# Patient Record
Sex: Male | Born: 1965 | Race: White | Hispanic: No | Marital: Married | Smoking: Former smoker
Health system: Southern US, Community
[De-identification: ages and names within clinical notes are randomized; demographics above are authoritative.]

## PROBLEM LIST (undated history)

## (undated) DIAGNOSIS — M1990 Unspecified osteoarthritis, unspecified site: Secondary | ICD-10-CM

---

## 2002-11-27 HISTORY — PX: KNEE ARTHROSCOPY: SHX127

## 2013-03-10 ENCOUNTER — Other Ambulatory Visit: Payer: Self-pay | Admitting: Neurosurgery

## 2013-03-11 ENCOUNTER — Encounter (HOSPITAL_COMMUNITY): Payer: Self-pay

## 2013-03-13 ENCOUNTER — Encounter (HOSPITAL_COMMUNITY)
Admission: RE | Admit: 2013-03-13 | Discharge: 2013-03-13 | Disposition: A | Discharge status: Home / Self Care | Payer: BC Managed Care – PPO | Source: Ambulatory Visit | Attending: Neurosurgery | Admitting: Neurosurgery

## 2013-03-13 ENCOUNTER — Encounter (HOSPITAL_COMMUNITY): Payer: Self-pay

## 2013-03-13 DIAGNOSIS — Z01812 Encounter for preprocedural laboratory examination: Secondary | ICD-10-CM | POA: Insufficient documentation

## 2013-03-13 HISTORY — DX: Unspecified osteoarthritis, unspecified site: M19.90

## 2013-03-13 LAB — SURGICAL PCR SCREEN: MRSA, PCR: NEGATIVE

## 2013-03-13 LAB — CBC
MCH: 30.1 pg (ref 26.0–34.0)
Platelets: 312 10*3/uL (ref 150–400)
RBC: 5.01 MIL/uL (ref 4.22–5.81)
RDW: 14 % (ref 11.5–15.5)
WBC: 11.3 10*3/uL — ABNORMAL HIGH (ref 4.0–10.5)

## 2013-03-13 NOTE — Pre-Procedure Instructions (Signed)
SIMRAN MANNIS  03/13/2013   Your procedure is scheduled on:  Wednesday, April 23rd.  Report to [facility] at 7:00  AM.  Call this number if you have problems the morning of surgery: [phone]   Remember:   Do not eat food or drink liquids after midnight.   Take these medicines the morning of surgery with A SIP OF WATER:  None  Stop taking Aspirin, Coumadin, Plavix, Effient and Herbal medications.  Do not take any NSAIDs ie: Ibuprofen,  Advil,Naproxen or any medication containing Aspirin.  Do not wear jewelry, make-up or nail polish.  Do not wear lotions, powders, or perfumes. You may wear deodorant.   Men may shave face and neck.  Do not bring valuables to the hospital.  Contacts, dentures or bridgework may not be worn into surgery.  Leave suitcase in the car. After surgery it may be brought to your room.  For patients admitted to the hospital, checkout time is 11:00 AM the day of  discharge.   Patients discharged the day of surgery will not be allowed to drive  home.  Name and phone number of your driver: -  Special Instructions: Shower using CHG 2 nights before surgery and the night before surgery.  If you shower the day of surgery use CHG.  Use special wash - you have one bottle of CHG for all showers.  You should use approximately 1/3 of the bottle for each shower.   Please read over the following fact sheets that you were given: Pain Booklet, Coughing and Deep Breathing and Surgical Site Infection Prevention

## 2013-03-19 ENCOUNTER — Inpatient Hospital Stay (HOSPITAL_COMMUNITY): Payer: BC Managed Care – PPO

## 2013-03-19 ENCOUNTER — Encounter (HOSPITAL_COMMUNITY): Payer: Self-pay | Admitting: Anesthesiology

## 2013-03-19 ENCOUNTER — Encounter (HOSPITAL_COMMUNITY): Payer: Self-pay | Admitting: *Deleted

## 2013-03-19 ENCOUNTER — Ambulatory Visit (HOSPITAL_COMMUNITY): Payer: BC Managed Care – PPO | Admitting: Anesthesiology

## 2013-03-19 ENCOUNTER — Inpatient Hospital Stay (HOSPITAL_COMMUNITY)
Admission: RE | Admit: 2013-03-19 | Discharge: 2013-03-21 | DRG: 758 | Disposition: A | Discharge status: Home / Self Care | Payer: BC Managed Care – PPO | Source: Ambulatory Visit | Attending: Neurosurgery | Admitting: Neurosurgery

## 2013-03-19 ENCOUNTER — Encounter (HOSPITAL_COMMUNITY)
Admission: RE | Disposition: A | Discharge status: Home / Self Care | Payer: Self-pay | Source: Ambulatory Visit | Attending: Neurosurgery

## 2013-03-19 DIAGNOSIS — M5126 Other intervertebral disc displacement, lumbar region: Principal | ICD-10-CM | POA: Diagnosis present

## 2013-03-19 DIAGNOSIS — Z87891 Personal history of nicotine dependence: Secondary | ICD-10-CM

## 2013-03-19 DIAGNOSIS — Z79899 Other long term (current) drug therapy: Secondary | ICD-10-CM

## 2013-03-19 DIAGNOSIS — Z01812 Encounter for preprocedural laboratory examination: Secondary | ICD-10-CM

## 2013-03-19 HISTORY — PX: LUMBAR LAMINECTOMY/DECOMPRESSION MICRODISCECTOMY: SHX5026

## 2013-03-19 HISTORY — PX: LUMBAR DISC SURGERY: SHX700

## 2013-03-19 SURGERY — LUMBAR LAMINECTOMY/DECOMPRESSION MICRODISCECTOMY 1 LEVEL
Anesthesia: General | Site: Back | Laterality: Right | Wound class: Clean

## 2013-03-19 MED ORDER — SODIUM CHLORIDE 0.9 % IV SOLN
INTRAVENOUS | Status: DC
  Administered 2013-03-20 – 2013-03-21 (×3): via INTRAVENOUS

## 2013-03-19 MED ORDER — DIAZEPAM 5 MG PO TABS
5.0000 mg | ORAL_TABLET | Freq: Four times a day (QID) | ORAL | Status: DC | PRN
  Administered 2013-03-19: 5 mg via ORAL

## 2013-03-19 MED ORDER — BUPIVACAINE LIPOSOME 1.3 % IJ SUSP: INTRAMUSCULAR | Status: DC | PRN

## 2013-03-19 MED ORDER — OXYCODONE HCL 5 MG/5ML PO SOLN: 5.0000 mg | Freq: Once | ORAL | Status: DC | PRN

## 2013-03-19 MED ORDER — OXYCODONE HCL 5 MG PO TABS: 5.0000 mg | ORAL_TABLET | Freq: Once | ORAL | Status: DC | PRN

## 2013-03-19 MED ORDER — PHENOL 1.4 % MT LIQD
1.0000 | OROMUCOSAL | Status: DC | PRN
  Administered 2013-03-20: 1 via OROMUCOSAL
  Filled 2013-03-19: qty 177

## 2013-03-19 MED ORDER — GLYCOPYRROLATE 0.2 MG/ML IJ SOLN
INTRAMUSCULAR | Status: DC | PRN
  Administered 2013-03-19: .6 mg via INTRAVENOUS

## 2013-03-19 MED ORDER — VECURONIUM BROMIDE 10 MG IV SOLR
INTRAVENOUS | Status: DC | PRN
  Administered 2013-03-19: 1 mg via INTRAVENOUS
  Administered 2013-03-19: 2 mg via INTRAVENOUS

## 2013-03-19 MED ORDER — NALOXONE HCL 0.4 MG/ML IJ SOLN: 0.4000 mg | INTRAMUSCULAR | Status: DC | PRN

## 2013-03-19 MED ORDER — ONDANSETRON HCL 4 MG/2ML IJ SOLN
INTRAMUSCULAR | Status: DC | PRN
  Administered 2013-03-19: 4 mg via INTRAVENOUS

## 2013-03-19 MED ORDER — DIPHENHYDRAMINE HCL 12.5 MG/5ML PO ELIX: 12.5000 mg | ORAL_SOLUTION | Freq: Four times a day (QID) | ORAL | Status: DC | PRN

## 2013-03-19 MED ORDER — SODIUM CHLORIDE 0.9 % IV SOLN: 250.0000 mL | INTRAVENOUS | Status: DC

## 2013-03-19 MED ORDER — ONDANSETRON HCL 4 MG/2ML IJ SOLN: 4.0000 mg | INTRAMUSCULAR | Status: DC | PRN

## 2013-03-19 MED ORDER — DIPHENHYDRAMINE HCL 50 MG/ML IJ SOLN: 12.5000 mg | Freq: Four times a day (QID) | INTRAMUSCULAR | Status: DC | PRN

## 2013-03-19 MED ORDER — MORPHINE SULFATE (PF) 1 MG/ML IV SOLN
INTRAVENOUS | Status: AC
  Filled 2013-03-19: qty 25

## 2013-03-19 MED ORDER — MIDAZOLAM HCL 5 MG/5ML IJ SOLN
INTRAMUSCULAR | Status: DC | PRN
  Administered 2013-03-19: 2 mg via INTRAVENOUS

## 2013-03-19 MED ORDER — FENTANYL CITRATE 0.05 MG/ML IJ SOLN
INTRAMUSCULAR | Status: DC | PRN
  Administered 2013-03-19 (×2): 50 ug via INTRAVENOUS
  Administered 2013-03-19: 75 ug via INTRAVENOUS
  Administered 2013-03-19 (×4): 50 ug via INTRAVENOUS
  Administered 2013-03-19: 125 ug via INTRAVENOUS

## 2013-03-19 MED ORDER — ZOLPIDEM TARTRATE 10 MG PO TABS: 10.0000 mg | ORAL_TABLET | Freq: Every evening | ORAL | Status: DC | PRN

## 2013-03-19 MED ORDER — SODIUM CHLORIDE 0.9 % IJ SOLN: 3.0000 mL | Freq: Two times a day (BID) | INTRAMUSCULAR | Status: DC

## 2013-03-19 MED ORDER — 0.9 % SODIUM CHLORIDE (POUR BTL) OPTIME
TOPICAL | Status: DC | PRN
  Administered 2013-03-19: 1000 mL

## 2013-03-19 MED ORDER — NEOSTIGMINE METHYLSULFATE 1 MG/ML IJ SOLN
INTRAMUSCULAR | Status: DC | PRN
  Administered 2013-03-19: 4 mg via INTRAVENOUS

## 2013-03-19 MED ORDER — METOCLOPRAMIDE HCL 5 MG/ML IJ SOLN: 10.0000 mg | Freq: Once | INTRAMUSCULAR | Status: DC | PRN

## 2013-03-19 MED ORDER — ARTIFICIAL TEARS OP OINT
TOPICAL_OINTMENT | OPHTHALMIC | Status: DC | PRN
  Administered 2013-03-19: 1 via OPHTHALMIC

## 2013-03-19 MED ORDER — BUPIVACAINE LIPOSOME 1.3 % IJ SUSP
20.0000 mL | INTRAMUSCULAR | Status: AC
  Administered 2013-03-19: 20 mL
  Filled 2013-03-19: qty 20

## 2013-03-19 MED ORDER — THROMBIN 5000 UNITS EX SOLR
CUTANEOUS | Status: DC | PRN
  Administered 2013-03-19 (×2): 5000 [IU] via TOPICAL

## 2013-03-19 MED ORDER — ONDANSETRON HCL 4 MG/2ML IJ SOLN: 4.0000 mg | Freq: Four times a day (QID) | INTRAMUSCULAR | Status: DC | PRN

## 2013-03-19 MED ORDER — HYDROMORPHONE HCL PF 1 MG/ML IJ SOLN: 0.2500 mg | INTRAMUSCULAR | Status: DC | PRN

## 2013-03-19 MED ORDER — PROPOFOL 10 MG/ML IV BOLUS
INTRAVENOUS | Status: DC | PRN
  Administered 2013-03-19: 200 mg via INTRAVENOUS

## 2013-03-19 MED ORDER — ACETAMINOPHEN 650 MG RE SUPP: 650.0000 mg | RECTAL | Status: DC | PRN

## 2013-03-19 MED ORDER — ROCURONIUM BROMIDE 100 MG/10ML IV SOLN
INTRAVENOUS | Status: DC | PRN
  Administered 2013-03-19: 50 mg via INTRAVENOUS

## 2013-03-19 MED ORDER — CEFAZOLIN SODIUM 1-5 GM-% IV SOLN
1.0000 g | Freq: Three times a day (TID) | INTRAVENOUS | Status: AC
  Administered 2013-03-19 – 2013-03-20 (×2): 1 g via INTRAVENOUS
  Filled 2013-03-19 (×2): qty 50

## 2013-03-19 MED ORDER — OXYCODONE-ACETAMINOPHEN 5-325 MG PO TABS
2.0000 | ORAL_TABLET | ORAL | Status: DC | PRN
  Administered 2013-03-19 – 2013-03-21 (×2): 2 via ORAL
  Filled 2013-03-19: qty 2

## 2013-03-19 MED ORDER — SODIUM CHLORIDE 0.9 % IJ SOLN: 9.0000 mL | INTRAMUSCULAR | Status: DC | PRN

## 2013-03-19 MED ORDER — ACETAMINOPHEN 325 MG PO TABS: 650.0000 mg | ORAL_TABLET | ORAL | Status: DC | PRN

## 2013-03-19 MED ORDER — DEXAMETHASONE SODIUM PHOSPHATE 10 MG/ML IJ SOLN
INTRAMUSCULAR | Status: DC | PRN
  Administered 2013-03-19: 10 mg via INTRAVENOUS

## 2013-03-19 MED ORDER — MENTHOL 3 MG MT LOZG: 1.0000 | LOZENGE | OROMUCOSAL | Status: DC | PRN

## 2013-03-19 MED ORDER — ZOLPIDEM TARTRATE 5 MG PO TABS: 10.0000 mg | ORAL_TABLET | Freq: Every evening | ORAL | Status: DC | PRN

## 2013-03-19 MED ORDER — LACTATED RINGERS IV SOLN
INTRAVENOUS | Status: DC | PRN
  Administered 2013-03-19 (×2): via INTRAVENOUS

## 2013-03-19 MED ORDER — HEMOSTATIC AGENTS (NO CHARGE) OPTIME
TOPICAL | Status: DC | PRN
  Administered 2013-03-19: 1 via TOPICAL

## 2013-03-19 MED ORDER — CEFAZOLIN SODIUM-DEXTROSE 2-3 GM-% IV SOLR
INTRAVENOUS | Status: AC
  Administered 2013-03-19: 2 g via INTRAVENOUS
  Filled 2013-03-19: qty 50

## 2013-03-19 MED ORDER — MORPHINE SULFATE (PF) 1 MG/ML IV SOLN
INTRAVENOUS | Status: DC
  Administered 2013-03-19: 6 mg via INTRAVENOUS
  Administered 2013-03-19: 3 mg via INTRAVENOUS
  Administered 2013-03-19: 14:00:00 via INTRAVENOUS
  Administered 2013-03-20: 3 mg via INTRAVENOUS
  Administered 2013-03-20: 17:00:00 via INTRAVENOUS
  Administered 2013-03-20: 6 mg via INTRAVENOUS
  Administered 2013-03-20: 12:00:00 via INTRAVENOUS
  Administered 2013-03-20: 1.5 mg via INTRAVENOUS
  Administered 2013-03-20: 18 mg via INTRAVENOUS
  Administered 2013-03-20: 36 mg via INTRAVENOUS
  Administered 2013-03-20: 8.45 mg via INTRAVENOUS
  Administered 2013-03-21: 2.61 mg via INTRAVENOUS
  Administered 2013-03-21: 10.5 mg via INTRAVENOUS
  Administered 2013-03-21: 9 mg via INTRAVENOUS
  Filled 2013-03-19 (×4): qty 25

## 2013-03-19 MED ORDER — LIDOCAINE HCL (CARDIAC) 20 MG/ML IV SOLN
INTRAVENOUS | Status: DC | PRN
  Administered 2013-03-19: 100 mg via INTRAVENOUS

## 2013-03-19 MED ORDER — OXYCODONE-ACETAMINOPHEN 5-325 MG PO TABS
ORAL_TABLET | ORAL | Status: AC
  Filled 2013-03-19: qty 2

## 2013-03-19 MED ORDER — SODIUM CHLORIDE 0.9 % IJ SOLN: 3.0000 mL | INTRAMUSCULAR | Status: DC | PRN

## 2013-03-19 MED ORDER — DIAZEPAM 5 MG PO TABS
ORAL_TABLET | ORAL | Status: AC
  Filled 2013-03-19: qty 1

## 2013-03-19 SURGICAL SUPPLY — 65 items
BENZOIN TINCTURE PRP APPL 2/3 (GAUZE/BANDAGES/DRESSINGS) ×2 IMPLANT
BLADE SURG ROTATE 9660 (MISCELLANEOUS) ×2 IMPLANT
BUR ACORN 6.0 (BURR) ×2 IMPLANT
BUR MATCHSTICK NEURO 3.0 LAGG (BURR) ×2 IMPLANT
CANISTER SUCTION 2500CC (MISCELLANEOUS) ×2 IMPLANT
CLOTH BEACON ORANGE TIMEOUT ST (SAFETY) ×2 IMPLANT
CONT SPEC 4OZ CLIKSEAL STRL BL (MISCELLANEOUS) ×2 IMPLANT
DRAPE LAPAROTOMY 100X72X124 (DRAPES) ×2 IMPLANT
DRAPE MICROSCOPE LEICA (MISCELLANEOUS) IMPLANT
DRAPE MICROSCOPE ZEISS OPMI (DRAPES) ×2 IMPLANT
DRAPE POUCH INSTRU U-SHP 10X18 (DRAPES) ×2 IMPLANT
DRSG PAD ABDOMINAL 8X10 ST (GAUZE/BANDAGES/DRESSINGS) ×2 IMPLANT
DURAPREP 26ML APPLICATOR (WOUND CARE) ×2 IMPLANT
DURASEAL APPLICATOR TIP (TIP) ×2 IMPLANT
DURASEAL SPINE SEALANT 3ML (MISCELLANEOUS) ×2 IMPLANT
ELECT BLADE 4.0 EZ CLEAN MEGAD (MISCELLANEOUS) ×2
ELECT REM PT RETURN 9FT ADLT (ELECTROSURGICAL) ×2
ELECTRODE BLDE 4.0 EZ CLN MEGD (MISCELLANEOUS) ×1 IMPLANT
ELECTRODE REM PT RTRN 9FT ADLT (ELECTROSURGICAL) ×1 IMPLANT
GAUZE SPONGE 4X4 16PLY XRAY LF (GAUZE/BANDAGES/DRESSINGS) IMPLANT
GLOVE BIOGEL M 8.0 STRL (GLOVE) ×2 IMPLANT
GLOVE BIOGEL PI IND STRL 7.5 (GLOVE) ×1 IMPLANT
GLOVE BIOGEL PI IND STRL 8 (GLOVE) ×2 IMPLANT
GLOVE BIOGEL PI IND STRL 8.5 (GLOVE) ×1 IMPLANT
GLOVE BIOGEL PI INDICATOR 7.5 (GLOVE) ×1
GLOVE BIOGEL PI INDICATOR 8 (GLOVE) ×2
GLOVE BIOGEL PI INDICATOR 8.5 (GLOVE) ×1
GLOVE ECLIPSE 6.5 STRL STRAW (GLOVE) ×4 IMPLANT
GLOVE ECLIPSE 7.5 STRL STRAW (GLOVE) ×2 IMPLANT
GLOVE EXAM NITRILE LRG STRL (GLOVE) IMPLANT
GLOVE EXAM NITRILE MD LF STRL (GLOVE) ×2 IMPLANT
GLOVE EXAM NITRILE XL STR (GLOVE) IMPLANT
GLOVE EXAM NITRILE XS STR PU (GLOVE) IMPLANT
GLOVE SURG SS PI 8.0 STRL IVOR (GLOVE) ×10 IMPLANT
GOWN BRE IMP SLV AUR LG STRL (GOWN DISPOSABLE) ×4 IMPLANT
GOWN BRE IMP SLV AUR XL STRL (GOWN DISPOSABLE) ×8 IMPLANT
GOWN STRL REIN 2XL LVL4 (GOWN DISPOSABLE) IMPLANT
KIT BASIN OR (CUSTOM PROCEDURE TRAY) ×2 IMPLANT
KIT ROOM TURNOVER OR (KITS) ×2 IMPLANT
NEEDLE HYPO 18GX1.5 BLUNT FILL (NEEDLE) IMPLANT
NEEDLE HYPO 21X1.5 SAFETY (NEEDLE) ×2 IMPLANT
NEEDLE HYPO 25X1 1.5 SAFETY (NEEDLE) ×2 IMPLANT
NEEDLE SPNL 20GX3.5 QUINCKE YW (NEEDLE) ×2 IMPLANT
NS IRRIG 1000ML POUR BTL (IV SOLUTION) ×2 IMPLANT
PACK LAMINECTOMY NEURO (CUSTOM PROCEDURE TRAY) ×2 IMPLANT
PAD ARMBOARD 7.5X6 YLW CONV (MISCELLANEOUS) ×6 IMPLANT
PATTIES SURGICAL .5 X1 (DISPOSABLE) ×2 IMPLANT
RUBBERBAND STERILE (MISCELLANEOUS) ×4 IMPLANT
SPONGE GAUZE 4X4 12PLY (GAUZE/BANDAGES/DRESSINGS) ×2 IMPLANT
SPONGE LAP 4X18 X RAY DECT (DISPOSABLE) IMPLANT
SPONGE SURGIFOAM ABS GEL SZ50 (HEMOSTASIS) ×2 IMPLANT
STRIP CLOSURE SKIN 1/2X4 (GAUZE/BANDAGES/DRESSINGS) ×2 IMPLANT
SUT ETHILON 3 0 FSL (SUTURE) ×2 IMPLANT
SUT PROLENE 6 0 BV (SUTURE) ×4 IMPLANT
SUT VIC AB 0 CT1 18XCR BRD8 (SUTURE) ×1 IMPLANT
SUT VIC AB 0 CT1 8-18 (SUTURE) ×1
SUT VIC AB 2-0 CP2 18 (SUTURE) ×2 IMPLANT
SUT VIC AB 3-0 SH 8-18 (SUTURE) ×2 IMPLANT
SYR 20CC LL (SYRINGE) ×2 IMPLANT
SYR 20ML ECCENTRIC (SYRINGE) ×2 IMPLANT
SYR 5ML LL (SYRINGE) IMPLANT
TAPE CLOTH SURG 4X10 WHT LF (GAUZE/BANDAGES/DRESSINGS) ×2 IMPLANT
TOWEL OR 17X24 6PK STRL BLUE (TOWEL DISPOSABLE) ×2 IMPLANT
TOWEL OR 17X26 10 PK STRL BLUE (TOWEL DISPOSABLE) ×2 IMPLANT
WATER STERILE IRR 1000ML POUR (IV SOLUTION) ×2 IMPLANT

## 2013-03-19 NOTE — OR Nursing (Signed)
Car assumed at arrival time noted in pacu

## 2013-03-19 NOTE — Anesthesia Postprocedure Evaluation (Signed)
Anesthesia Post Note  Patient: Samuel Robinson  Procedure(s) Performed: Procedure(s) (LRB): Right lumbar five-sacral one discectomy  (Right)  Anesthesia type: general  Patient location: PACU  Post pain: Pain level controlled  Post assessment: Patient's Cardiovascular Status Stable  Last Vitals:  Filed Vitals:   03/19/13 1335  BP:   Pulse: 54  Temp: 36.6 C  Resp: 16    Post vital signs: Reviewed and stable  Level of consciousness: sedated  Complications: No apparent anesthesia complications

## 2013-03-19 NOTE — H&P (Signed)
Samuel Robinson is an 47 y.o. male.   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 No pain in left leg  Past Medical History  Diagnosis Date  . Arthritis     ? elbow right    Past Surgical History  Procedure Laterality Date  . Knee arthroscopy Right 2004    History reviewed. No pertinent family history. Social History:  reports that he has quit smoking. He does not have any smokeless tobacco history on file. He reports that he does not drink alcohol or use illicit drugs.  Allergies: No Known Allergies  Medications Prior to Admission  Medication Sig Dispense Refill  . B Complex-C (B-COMPLEX WITH VITAMIN C) tablet Take 1 tablet by mouth daily.      . fish oil-omega-3 fatty acids 1000 MG capsule Take 1 g by mouth daily.      . Glucos-Chondroit-Hyaluron-MSM (GLUCOSAMINE CHONDROITIN JOINT PO) Take 1 tablet by mouth daily.      Marland Kitchen ibuprofen (ADVIL,MOTRIN) 200 MG tablet Take 200 mg by mouth every 6 (six) hours as needed for pain.      Marland Kitchen MAGNESIUM PO Take 1 capsule by mouth daily.      . Multiple Vitamin (MULTIVITAMIN WITH MINERALS) TABS Take 1 tablet by mouth daily.        No results found for this or any previous visit (from the past 48 hour(s)). No results found.  Review of Systems  HENT: Negative.   Eyes: Negative.   Respiratory: Negative.   Gastrointestinal: Negative.   Genitourinary: Negative.   Musculoskeletal: Positive for back pain.  Skin: Negative.   Neurological: Positive for focal weakness and weakness.  Endo/Heme/Allergies: Negative.   Psychiatric/Behavioral: Negative.     Blood pressure 124/82, pulse 55, temperature 97.9 F (36.6 C), temperature source Oral, resp. rate 20, SpO2 98.00%. Physical Exam hent,nl. Neck,nl. Cv, nl. Lungs,clear.abdomen,soft. Extremities nl.NEURO right  O/5 plantiflexion, 3/5  dorsiflexion.  athrophy of the fright calf by 3 inches.sensory, coldness of foot,nl pulses. Numbness of s1 dermetome. 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 Emg, chronic radiculopathy. Assessment/Plan Decompression of right l5s1 space with discectomy and foraminotomy. Patient aware of risks and benefits including the possibility of no improvement secondary to the athrophy  Samuel Robinson M 03/19/2013, 10:01 AM

## 2013-03-19 NOTE — Progress Notes (Signed)
Patient ID: Samuel Robinson, male   DOB: 01-06-1966, 47 y.o.   MRN: [id number] Right foot BACK to normal strength. No ha

## 2013-03-19 NOTE — Progress Notes (Signed)
Op note [id number]

## 2013-03-19 NOTE — Preoperative (Signed)
Beta Blockers   Reason not to administer Beta Blockers:Not Applicable 

## 2013-03-19 NOTE — Anesthesia Procedure Notes (Signed)
Procedure Name: Intubation Date/Time: 03/19/2013 10:47 AM Performed by: Angelica Pou Pre-anesthesia Checklist: Patient identified, Timeout performed, Emergency Drugs available, Suction available and Patient being monitored Patient Re-evaluated:Patient Re-evaluated prior to inductionOxygen Delivery Method: Circle system utilized Preoxygenation: Pre-oxygenation with 100% oxygen Intubation Type: IV induction Ventilation: Oral airway inserted - appropriate to patient size and Two handed mask ventilation required Laryngoscope Size: Mac and 4 Grade View: Grade II Tube type: Oral Tube size: 7.5 mm Airway Equipment and Method: Stylet and Oral airway Placement Confirmation: ETT inserted through vocal cords under direct vision,  breath sounds checked- equal and bilateral and positive ETCO2 Secured at: 23 cm Tube secured with: Tape Dental Injury: Teeth and Oropharynx as per pre-operative assessment

## 2013-03-19 NOTE — Anesthesia Preprocedure Evaluation (Signed)
Anesthesia Evaluation  Patient identified by MRN, date of birth, ID band Patient awake    Reviewed: Allergy & Precautions, H&P , NPO status , Patient's Chart, lab work & pertinent test results, reviewed documented beta blocker date and time   Airway Mallampati: II TM Distance: >3 FB Neck ROM: full    Dental   Pulmonary neg pulmonary ROS, former smoker,  breath sounds clear to auscultation        Cardiovascular negative cardio ROS  Rhythm:regular     Neuro/Psych negative neurological ROS  negative psych ROS   GI/Hepatic negative GI ROS, Neg liver ROS,   Endo/Other  negative endocrine ROS  Renal/GU negative Renal ROS  negative genitourinary   Musculoskeletal   Abdominal   Peds  Hematology negative hematology ROS (+)   Anesthesia Other Findings See surgeon's H&P   Reproductive/Obstetrics negative OB ROS                           Anesthesia Physical Anesthesia Plan  ASA: II  Anesthesia Plan: General   Post-op Pain Management:    Induction: Intravenous  Airway Management Planned: Oral ETT  Additional Equipment:   Intra-op Plan:   Post-operative Plan: Extubation in OR  Informed Consent: I have reviewed the patients History and Physical, chart, labs and discussed the procedure including the risks, benefits and alternatives for the proposed anesthesia with the patient or authorized representative who has indicated his/her understanding and acceptance.   Dental Advisory Given  Plan Discussed with: CRNA and Surgeon  Anesthesia Plan Comments:         Anesthesia Quick Evaluation  

## 2013-03-19 NOTE — Transfer of Care (Signed)
Immediate Anesthesia Transfer of Care Note  Patient: Samuel Robinson  Procedure(s) Performed: Procedure(s) with comments: Right lumbar five-sacral one discectomy  (Right) - Right Lumbar five-sacral one Diskectomy  Patient Location: PACU  Anesthesia Type:General  Level of Consciousness: awake, alert , oriented and patient cooperative  Airway & Oxygen Therapy: Patient Spontanous Breathing and Patient connected to nasal cannula oxygen  Post-op Assessment: Report given to PACU RN, Post -op Vital signs reviewed and stable and Patient moving all extremities X 4  Post vital signs: Reviewed and stable  Complications: No apparent anesthesia complications

## 2013-03-20 MED ORDER — IBUPROFEN 600 MG PO TABS
600.0000 mg | ORAL_TABLET | Freq: Four times a day (QID) | ORAL | Status: DC | PRN
  Administered 2013-03-21: 600 mg via ORAL
  Filled 2013-03-20: qty 1

## 2013-03-20 NOTE — Progress Notes (Signed)
03/20/2013 Cipriano Mile OTR/L Pager [phone] Office [phone]

## 2013-03-20 NOTE — Progress Notes (Signed)
Patient ID: Samuel Robinson, male   DOB: August 05, 1966, 47 y.o.   MRN: [id number] Seen this am. Normal strenght in right foot. No headache. Plan, oob in am and dc if stable

## 2013-03-20 NOTE — Progress Notes (Signed)
PT Cancellation Note  Patient Details Name: JADARIAN MCKAY MRN: [id number] DOB: [birth year]   Cancelled Treatment:    Reason Eval/Treat Not Completed: Per RN pt on bed rest today. Will f/u tomorrow.    [facility] HELEN 03/20/2013, 9:55 AM Pager: [phone]

## 2013-03-20 NOTE — Clinical Social Work Note (Signed)
Clinical Social Work   CSW received consult for SNF. CSW reviewed chart and discussed pt with RN during progression. Awaiting PT/OT evals for discharge recommendations. PT/OT evals are needed for prior auth for SNF placement. CSW will assess for SNF, if appropriate. CSW will continue to follow.   Kashay Cavenaugh, MSW, LCSW #[phone] 

## 2013-03-20 NOTE — Progress Notes (Signed)
Utilization review completed. Arlethia Basso, RN, BSN. 

## 2013-03-20 NOTE — Op Note (Signed)
NAME:  Samuel Robinson, Samuel Robinson                  ACCOUNT NO.:  [id number]  MEDICAL RECORD NO.:  [id number]  LOCATION:  [id number]                        FACILITY:  [facility]  PHYSICIAN:  Hilda Lias, M.D.   DATE OF BIRTH:  February 17, 1966  DATE OF PROCEDURE:  03/19/2013 DATE OF DISCHARGE:                              OPERATIVE REPORT   PREOPERATIVE DIAGNOSIS:  Right L5-S1 herniated disk with atrophy of the right calf 3/5 weakness with plantar flexion, 3/5 weakness with dorsiflexion.  POSTOPERATIVE DIAGNOSIS:  Right L5-S1 herniated disk with atrophy of the right calf 3/5 weakness with plantar flexion, 3/5 weakness with dorsiflexion.  PROCEDURE:  Right L5-S1 diskectomy, decompression of thecal sac, removal of large calcified disk at the axilla of the nerve.  Repair of durotomy. Microscope.  SURGEON:  Hilda Lias, M.D.  ASSISTANT:  Coletta Memos, M.D.  CLINICAL HISTORY:  [name] is a gentleman who had been complaining of back pain worsened to the right leg for more than 2 years.  By the time I saw him, he has atrophy of the right calf 3 inches smaller than the left one.  Absent at the ankle jerk.  0/5 weakness of plantar flexion, 3/5 weakness of dorsiflexion.  MRI showed large herniated disk at L5-1. The patient knew the risk with the surgery including possibility of no improvement, CSF leak because of the calcification.  PROCEDURE:  The patient was taken to the OR, and after intubation, he was positioned in a prone manner.  The back was cleaned with DuraPrep. Midline incision from L5-S1 was made and incision was carried down through a thick adipose tissue straight down to the lumbar area.  X-rays showed that we were right at the level of 5-1.  Because of the size, we brought the microscope into the area.  We drilled the lower lamina of L5 and the upper of S1 and thick calcified yellow ligament was also excised.  Immediately, we found that the dura mater was attached to the floor and it was  difficult to mobilize the S1 nerve root.  It was quite narrow and yellowish.  Decompression microdissection was carried out laterally and finally we were able to get into the lateral aspect of the disk space.  We made a small incision and using the micro curette, we worked our way to the midline and we were able to remove large midline calcified disk.  Decompression of the thecal sac as well as the S1 nerve root was done.  Then on investigation of the axilla, we found that the patient had a large fragment calcified attached to the axilla. Dissection was carried out at the end using the microcurette as well as the 1 mm Kerrison punch.  We were able to decompress the thecal sac and the S1 nerve root.  There was a small opening in the dura mater, which was taken care with a single stitch of 6-0 Prolene. Valsalva up to 40 was negative.  Then, the area was irrigated.  The area was closed with nylon.          ______________________________ Hilda Lias, M.D.     EB/MEDQ  D:  03/19/2013  T:  03/20/2013  Job:  [id number] 

## 2013-03-20 NOTE — Progress Notes (Signed)
Pt had not urinated since before surgery this morning. Pt on bedrest and in Trendelenberg position at -5 degrees.  Pt complaining of increasing abdominal pain. Pt was bladder scanned and only 200 cc of urine was detected. Md notified and order received to do an I&O cath.  I&O drained 1100 cc of urine out of the bladder. Pt's pain was relieved. RN will continue to monitor.

## 2013-03-21 ENCOUNTER — Encounter (HOSPITAL_COMMUNITY): Payer: Self-pay | Admitting: Neurosurgery

## 2013-03-21 NOTE — Clinical Social Work Note (Signed)
Clinical Social Work   CSW discussed pt with RN during progression. CSW was updated by PT and pt will not need SNF placement. CSW updated RNCM. CSW will sign off at this time, as no further needs identified. Please reconsult if a need arises prior to discharge.   Dede Query, MSW, LCSW [phone]

## 2013-03-21 NOTE — Discharge Summary (Signed)
Physician Discharge Summary  Patient ID: Samuel Robinson MRN: [id number] DOB/AGE: February 17, 1966 47 y.o.  Admit date: 03/19/2013 Discharge date: 03/21/2013  Admission Diagnoses:right l5s1 hnp chronic radiculopat Discharge Diagnoses: same.     Discharged Condition: no pain no weakness as preop  Hospital Course: surgery  Consults: none  Significant Diagnostic Studies: mri  Treatments: right l5s1 discectomy. duratotomy closure  Discharge Exam: Blood pressure 128/72, pulse 59, temperature 98.1 F (36.7 C), temperature source Oral, resp. rate 16, height 6\' 3"  (1.905 m), weight 136.079 kg (300 lb), SpO2 98.00%.no pain, no weakness. Ambulating. No headache  Disposition:hime on roxycodone and diazepam     Medication List    ASK your doctor about these medications       B-complex with vitamin C tablet  Take 1 tablet by mouth daily.     fish oil-omega-3 fatty acids 1000 MG capsule  Take 1 g by mouth daily.     GLUCOSAMINE CHONDROITIN JOINT PO  Take 1 tablet by mouth daily.     ibuprofen 200 MG tablet  Commonly known as:  ADVIL,MOTRIN  Take 200 mg by mouth every 6 (six) hours as needed for pain.     MAGNESIUM PO  Take 1 capsule by mouth daily.     multivitamin with minerals Tabs  Take 1 tablet by mouth daily.         Signed: Karn Cassis 03/21/2013, 11:04 AM

## 2013-03-21 NOTE — Care Management Note (Signed)
    Page 1 of 1   03/21/2013     2:08:23 PM   CARE MANAGEMENT NOTE 03/21/2013  Patient:  Samuel Robinson, Samuel Robinson   Account Number:  [id number]  Date Initiated:  03/20/2013  Documentation initiated by:  [facility]  Subjective/Objective Assessment:   Admitted postop L5-S1 discectomy and dura tear repair     Action/Plan:   PT/OT to eval after patient off bedrest  PT recommended outpatient PT after cleared by neurosurgeon   Anticipated DC Date:  03/23/2013   Anticipated DC Plan:  HOME/SELF CARE      DC Planning Services  CM consult      Choice offered to / List presented to:             Status of service:  Completed, signed off Medicare Important Message given?   (If response is "NO", the following Medicare IM given date fields will be blank) Date Medicare IM given:   Date Additional Medicare IM given:    Discharge Disposition:  HOME/SELF CARE  Per UR Regulation:  Reviewed for med. necessity/level of care/duration of stay  If discussed at [place] Length of Stay Meetings, dates discussed:    Comments:

## 2013-03-21 NOTE — Evaluation (Signed)
Occupational Therapy Evaluation Patient Details Name: SHRIHAN PUTT MRN: [id number] DOB: [birth year] Today's Date: 03/21/2013 Time: 4098-1191 OT Time Calculation (min): 17 min  OT Assessment / Plan / Recommendation Clinical Impression  Pt doing well following right L5-S1 diskectomy, decompression of thecal sac, removal of large calcified disk at the axilla of the nerve. Pt requires mod A with LB ADLs and is independent with ADL mobillity and set up/sup with UB ADLs. All education completed and no further acute or follow up OT services needed at this time, OT will sign off    OT Assessment  Patient does not need any further OT services    Follow Up Recommendations  No OT follow up    Barriers to Discharge  None    Equipment Recommendations  Tub/shower bench;Other (comment) (ADL A/E kit)    Recommendations for Other Services  None  Frequency       Precautions / Restrictions Precautions Precautions: Back Precaution Comments: verbalizes 3/3 back precautions andnis able to maintain during activity  Restrictions Weight Bearing Restrictions: No   Pertinent Vitals/Pain 2/10    ADL  Grooming: Performed;Wash/dry hands;Wash/dry face;Teeth care;Supervision/safety Where Assessed - Grooming: Unsupported standing Upper Body Bathing: Simulated;Supervision/safety;Set up Lower Body Bathing: Simulated;Moderate assistance Upper Body Dressing: Performed;Supervision/safety;Set up Lower Body Dressing: Simulated;Moderate assistance Toilet Transfer: Performed;Modified independent Toilet Transfer Method: Sit to Barista: Regular height toilet;Grab bars Toileting - Clothing Manipulation and Hygiene: Performed;Supervision/safety Where Assessed - Engineer, mining and Hygiene: Standing Tub/Shower Transfer: Performed;Modified independent Tub/Shower Transfer Method: Science writer: Grab bars;Transfer tub bench ADL Comments: Pt provded with  education and demo of ADL A/E, tub bench and handout for toileting aide for use at home    OT Diagnosis:    OT Problem List:   OT Treatment Interventions:     OT Goals    Visit Information  Last OT Received On: 03/21/13 Assistance Needed: +1    Subjective Data  Subjective: " I will go home this afternoon " Patient Stated Goal: To return home   Prior Functioning     Home Living Lives With: Significant other Available Help at Discharge: Family;Available PRN/intermittently Type of Home: House Home Access: Stairs to enter [employer] of Steps: 2 Entrance Stairs-Rails: None Home Layout: One level Bathroom Shower/Tub: Network engineer: None Prior Function Level of Independence: Independent Able to Take Stairs?: Yes Driving: Yes Vocation: Full time employment Comments: works in Clinical research associate at [employer]: No difficulties Dominant Hand: Right         Vision/Perception Vision - History Baseline Vision: Wears contacts Patient Visual Report: No change from baseline Perception Perception: Within Functional Limits   Cognition  Cognition Arousal/Alertness: Awake/alert Behavior During Therapy: WFL for tasks assessed/performed Overall Cognitive Status: Within Functional Limits for tasks assessed    Extremity/Trunk Assessment Right Upper Extremity Assessment RUE ROM/Strength/Tone: WFL for tasks assessed RUE Sensation: WFL - Light Touch RUE Coordination: WFL - gross/fine motor Left Upper Extremity Assessment LUE ROM/Strength/Tone: WFL for tasks assessed LUE Sensation: WFL - Light Touch LUE Coordination: WFL - gross/fine motor   Mobility Bed Mobility Bed Mobility: Not assessed Rolling Left: 6: Modified independent (Device/Increase time) Left Sidelying to Sit: HOB flat;6: Modified independent (Device/Increase time) Details for Bed Mobility Assistance: initial cueing for log  roll Transfers Sit to Stand: 6: Modified independent (Device/Increase time);Without upper extremity assist;From chair/3-in-1;From toilet Stand to Sit: 6: Modified independent (Device/Increase time);To chair/3-in-1;Without upper extremity assist;To toilet Details for  Transfer Assistance: initial cues for technique and posture     Exercise     Balance Balance Balance Assessed: Yes Static Standing Balance Static Standing - Balance Support: No upper extremity supported Static Standing - Level of Assistance: 7: Independent Dynamic Standing Balance Dynamic Standing - Balance Support: No upper extremity supported;During functional activity Dynamic Standing - Level of Assistance: 7: Independent   End of Session OT - End of Session Equipment Utilized During Treatment: Other (comment) (ADL A/E, tub bench) Patient left: in chair;with call bell/phone within reach  GO     Galen Manila 03/21/2013, 2:33 PM

## 2013-03-21 NOTE — Progress Notes (Signed)
Pt's heart rate has been dropping into the upper 40's throughout the night. All other vitals are stable and pt is asymptomatic. Pt on full-dose morphine PCA. Md notified. RN will continue to monitor.

## 2013-03-21 NOTE — Evaluation (Signed)
Physical Therapy Evaluation Patient Details Name: Samuel Robinson MRN: [id number] DOB: 02/17/1966 Today's Date: 03/21/2013 Time: 9147-8295 PT Time Calculation (min): 42 min  PT Assessment / Plan / Recommendation Clinical Impression  47 y/o male s/p right L5-S1 diskectomy, decompression of thecal sac, removal of large calcified disk at the axilla of the nerve. Moving well this morning. All education completed. F/u with Dr. Jeral Fruit to determine when he can begin OPPT. No further acute PT needs at thist ime.     PT Assessment  All further PT needs can be met in the next venue of care    Follow Up Recommendations  Outpatient PT (when cleared by neuro surgeon)    Does the patient have the potential to tolerate intense rehabilitation      Barriers to Discharge        Equipment Recommendations  None recommended by PT    Recommendations for Other Services     Frequency      Precautions / Restrictions Precautions Precautions: Back Precaution Booklet Issued: Yes (comment) Precaution Comments: verbalizes 3/3 back precautions, good demonstration with movement Restrictions Weight Bearing Restrictions: No   Pertinent Vitals/Pain Reports minor low back pain, 2/10      Mobility  Bed Mobility Bed Mobility: Rolling Left;Left Sidelying to Sit Rolling Left: 6: Modified independent (Device/Increase time) Left Sidelying to Sit: HOB flat;6: Modified independent (Device/Increase time) Details for Bed Mobility Assistance: initial cueing for log roll Transfers Transfers: Sit to Stand;Stand to Sit Sit to Stand: 6: Modified independent (Device/Increase time);With upper extremity assist;From bed Stand to Sit: 6: Modified independent (Device/Increase time);With upper extremity assist;To chair/3-in-1 Details for Transfer Assistance: initial cues for technique and posture Ambulation/Gait Ambulation/Gait Assistance: 6: Modified independent (Device/Increase time) Ambulation Distance (Feet): 400  Feet Assistive device: None Ambulation/Gait Assistance Details: slow gait with decreased trunk rotation and decreased step length, good balance, occasional cues for precautions Gait Pattern: Step-through pattern;Decreased stride length Gait velocity: WFL Stairs: Yes Stairs Assistance: 6: Modified independent (Device/Increase time) Stair Management Technique: One rail Right Number of Stairs: 4    Exercises     PT Diagnosis: Difficulty walking;Acute pain  PT Problem List: Decreased activity tolerance;Decreased strength PT Treatment Interventions:     PT Goals    Visit Information  Last PT Received On: 03/21/13 Assistance Needed: +1    Subjective Data      Prior Functioning  Home Living Lives With: Significant other Available Help at Discharge: Family;Available PRN/intermittently Type of Home: House Home Access: Stairs to enter [employer] of Steps: 2 Entrance Stairs-Rails: None Home Layout: One level Bathroom Shower/Tub: Engineer, manufacturing systems: Standard Home Adaptive Equipment: None Prior Function Level of Independence: Independent Able to Take Stairs?: Yes Driving: Yes Vocation: Full time employment Comments: works at Actor, Therapist, music: No difficulties    Cognition  Cognition Arousal/Alertness: Awake/alert Behavior During Therapy: WFL for tasks assessed/performed Overall Cognitive Status: Within Functional Limits for tasks assessed    Extremity/Trunk Assessment Right Upper Extremity Assessment RUE ROM/Strength/Tone: [facility] for tasks assessed Left Upper Extremity Assessment LUE ROM/Strength/Tone: WFL for tasks assessed Right Lower Extremity Assessment RLE ROM/Strength/Tone: Deficits;WFL for tasks assessed RLE ROM/Strength/Tone Deficits: 4+/5 DF/PF RLE Sensation: Deficits RLE Sensation Deficits: impaired LT lateral leg Left Lower Extremity Assessment LLE ROM/Strength/Tone: WFL for tasks assessed   Balance  Balance Balance Assessed: Yes Static Standing Balance Static Standing - Balance Support: No upper extremity supported Static Standing - Level of Assistance: 7: Independent  End of Session PT - End  of Session Equipment Utilized During Treatment: Gait belt Activity Tolerance: Patient tolerated treatment well Patient left: in chair;with call bell/phone within reach Nurse Communication: Mobility status  GP     [facility] HELEN 03/21/2013, 1:20 PM

## 2014-06-09 IMAGING — CR DG LUMBAR SPINE 1V
1 series · 1 of 1 positions shown · non-contrast
Comparison: MRI 01/06/2013

CLINICAL DATA: Low back pain

LUMBAR SPINE - 1 VIEW

[view not recorded]
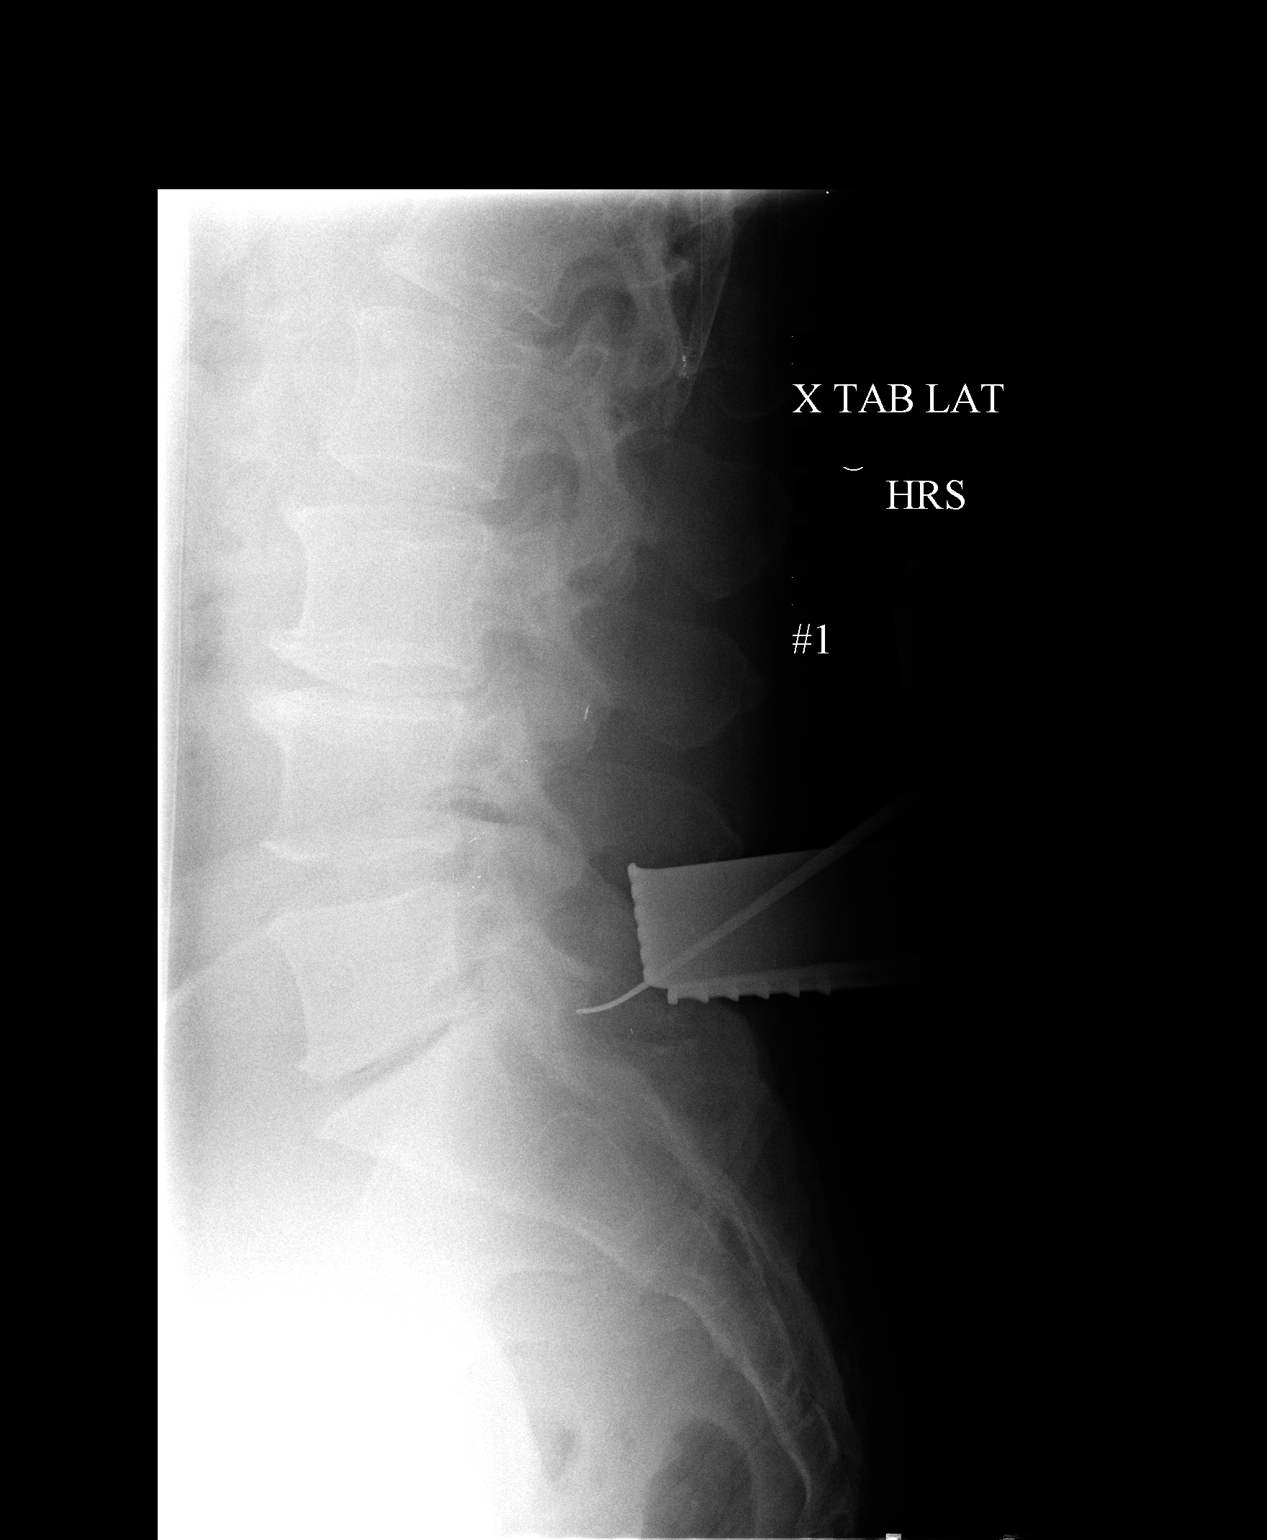

[1 of 1 positions shown; findings below may reference images not displayed]

FINDINGS: Cross-table lateral film #1 demonstrates a probe from
posterior approach directed most closely toward the L5-S1
interspace.
IMPRESSION: As above.

## 2016-07-07 DIAGNOSIS — Z0000 Encounter for general adult medical examination without abnormal findings: Secondary | ICD-10-CM | POA: Diagnosis not present

## 2016-07-07 DIAGNOSIS — Z125 Encounter for screening for malignant neoplasm of prostate: Secondary | ICD-10-CM | POA: Diagnosis not present

## 2017-02-21 DIAGNOSIS — K080 Exfoliation of teeth due to systemic causes: Secondary | ICD-10-CM | POA: Diagnosis not present

## 2017-03-12 DIAGNOSIS — M5136 Other intervertebral disc degeneration, lumbar region: Secondary | ICD-10-CM | POA: Diagnosis not present

## 2017-03-15 DIAGNOSIS — K080 Exfoliation of teeth due to systemic causes: Secondary | ICD-10-CM | POA: Diagnosis not present

## 2017-05-24 DIAGNOSIS — R05 Cough: Secondary | ICD-10-CM | POA: Diagnosis not present

## 2017-05-24 DIAGNOSIS — J40 Bronchitis, not specified as acute or chronic: Secondary | ICD-10-CM | POA: Diagnosis not present

## 2017-08-29 DIAGNOSIS — K080 Exfoliation of teeth due to systemic causes: Secondary | ICD-10-CM | POA: Diagnosis not present

## 2017-09-04 DIAGNOSIS — K080 Exfoliation of teeth due to systemic causes: Secondary | ICD-10-CM | POA: Diagnosis not present

## 2017-09-05 DIAGNOSIS — K080 Exfoliation of teeth due to systemic causes: Secondary | ICD-10-CM | POA: Diagnosis not present

## 2017-09-25 DIAGNOSIS — K080 Exfoliation of teeth due to systemic causes: Secondary | ICD-10-CM | POA: Diagnosis not present

## 2017-10-15 DIAGNOSIS — Z131 Encounter for screening for diabetes mellitus: Secondary | ICD-10-CM | POA: Diagnosis not present

## 2017-10-15 DIAGNOSIS — Z13220 Encounter for screening for lipoid disorders: Secondary | ICD-10-CM | POA: Diagnosis not present

## 2017-10-15 DIAGNOSIS — Z125 Encounter for screening for malignant neoplasm of prostate: Secondary | ICD-10-CM | POA: Diagnosis not present

## 2017-10-15 DIAGNOSIS — Z0000 Encounter for general adult medical examination without abnormal findings: Secondary | ICD-10-CM | POA: Diagnosis not present

## 2017-10-26 DIAGNOSIS — J0120 Acute ethmoidal sinusitis, unspecified: Secondary | ICD-10-CM | POA: Diagnosis not present

## 2018-02-27 DIAGNOSIS — K080 Exfoliation of teeth due to systemic causes: Secondary | ICD-10-CM | POA: Diagnosis not present

## 2018-04-08 DIAGNOSIS — E1165 Type 2 diabetes mellitus with hyperglycemia: Secondary | ICD-10-CM | POA: Diagnosis not present

## 2018-04-08 DIAGNOSIS — Z79899 Other long term (current) drug therapy: Secondary | ICD-10-CM | POA: Diagnosis not present

## 2018-04-08 DIAGNOSIS — N529 Male erectile dysfunction, unspecified: Secondary | ICD-10-CM | POA: Diagnosis not present

## 2018-04-08 DIAGNOSIS — E7800 Pure hypercholesterolemia, unspecified: Secondary | ICD-10-CM | POA: Diagnosis not present

## 2018-04-08 DIAGNOSIS — M5136 Other intervertebral disc degeneration, lumbar region: Secondary | ICD-10-CM | POA: Diagnosis not present

## 2018-08-29 DIAGNOSIS — K080 Exfoliation of teeth due to systemic causes: Secondary | ICD-10-CM | POA: Diagnosis not present

## 2018-10-09 DIAGNOSIS — E1165 Type 2 diabetes mellitus with hyperglycemia: Secondary | ICD-10-CM | POA: Diagnosis not present

## 2018-10-09 DIAGNOSIS — Z79899 Other long term (current) drug therapy: Secondary | ICD-10-CM | POA: Diagnosis not present

## 2018-10-09 DIAGNOSIS — E7800 Pure hypercholesterolemia, unspecified: Secondary | ICD-10-CM | POA: Diagnosis not present

## 2018-10-09 DIAGNOSIS — M5136 Other intervertebral disc degeneration, lumbar region: Secondary | ICD-10-CM | POA: Diagnosis not present

## 2018-12-31 DIAGNOSIS — M5416 Radiculopathy, lumbar region: Secondary | ICD-10-CM | POA: Diagnosis not present

## 2018-12-31 DIAGNOSIS — E782 Mixed hyperlipidemia: Secondary | ICD-10-CM | POA: Diagnosis not present

## 2018-12-31 DIAGNOSIS — R7303 Prediabetes: Secondary | ICD-10-CM | POA: Diagnosis not present

## 2018-12-31 DIAGNOSIS — Z6839 Body mass index (BMI) 39.0-39.9, adult: Secondary | ICD-10-CM | POA: Diagnosis not present

## 2019-01-06 DIAGNOSIS — J0101 Acute recurrent maxillary sinusitis: Secondary | ICD-10-CM | POA: Diagnosis not present

## 2019-01-06 DIAGNOSIS — J101 Influenza due to other identified influenza virus with other respiratory manifestations: Secondary | ICD-10-CM | POA: Diagnosis not present

## 2019-10-28 DIAGNOSIS — Z1159 Encounter for screening for other viral diseases: Secondary | ICD-10-CM | POA: Diagnosis not present

## 2019-10-28 DIAGNOSIS — M5416 Radiculopathy, lumbar region: Secondary | ICD-10-CM | POA: Diagnosis not present

## 2019-10-28 DIAGNOSIS — R7303 Prediabetes: Secondary | ICD-10-CM | POA: Diagnosis not present

## 2019-10-28 DIAGNOSIS — E782 Mixed hyperlipidemia: Secondary | ICD-10-CM | POA: Diagnosis not present

## 2020-01-08 ENCOUNTER — Encounter: Payer: Self-pay | Admitting: Diagnostic Neuroimaging

## 2020-01-23 ENCOUNTER — Other Ambulatory Visit: Payer: Self-pay

## 2020-01-23 MED ORDER — OXYCODONE HCL 10 MG PO TABS: 10.0000 mg | ORAL_TABLET | Freq: Three times a day (TID) | ORAL | 0 refills | Status: DC | PRN

## 2020-02-02 ENCOUNTER — Other Ambulatory Visit: Payer: Self-pay

## 2020-02-02 ENCOUNTER — Ambulatory Visit (INDEPENDENT_AMBULATORY_CARE_PROVIDER_SITE_OTHER): Payer: Federal, State, Local not specified - PPO | Admitting: Neurology

## 2020-02-02 ENCOUNTER — Ambulatory Visit: Payer: Federal, State, Local not specified - PPO | Admitting: Neurology

## 2020-02-02 DIAGNOSIS — Z0289 Encounter for other administrative examinations: Secondary | ICD-10-CM

## 2020-02-02 DIAGNOSIS — R29898 Other symptoms and signs involving the musculoskeletal system: Secondary | ICD-10-CM

## 2020-02-02 NOTE — Progress Notes (Signed)
EMG report is under procedure tab. 

## 2020-02-02 NOTE — Procedures (Signed)
Full Name: Samuel Robinson Gender: Male MRN #: [id number] Date of Birth: [birth year]    Visit Date: 02/02/2020 09:57 Age: 54 Years Examining Physician: Marcial Pacas, MD  Referring Physician: Reinaldo Meeker, MD History: 54 year old male, with history of right L5-S1 radiculopathy, status post decompression April 2014, continue complains of right foot numbness more than left foot, right leg weakness.  On examination, he has obesity, mild right calf muscle atrophy, mild right toe flexion, extension weakness, decreased vibratory sensation to distal shin level on the right side, to ankle level on the left side.  Bilateral patellar reflexes were decreased, absent ankle reflexes.  He walks with mildly limp, dragging his right leg, could not stand up on right tiptoe, mild difficulty with right heel walking.  Summary of the tests:  Nerve conduction study: Bilateral superficial sensory responses showed significantly decreased snap amplitude, with mildly prolonged peak latency.  Bilateral sural sensory responses were normal.  Left tibial motor responses showed mildly decreased conduction velocity, was otherwise normal.  Right tibial motor responses showed mildly prolonged distal latency, with normal CMAP amplitude, and slower conduction velocity.  Bilateral peroneal to EDB motor responses showed significantly decreased CMAP amplitude, and conduction velocity.  Electromyography:  Selected needle examination of bilateral lower extremity muscles, and bilateral lumbosacral paraspinal muscles were performed.  There is evidence of chronic neuropathic changes at right tibialis posterior, gastrocnemius, milder degree at right tibialis anterior, peroneal longus.  Right lumbar paraspinal muscles showed increased insertional activity, small polyphasic amplitude  Conclusion: This is an abnormal study.  There is electrodiagnostic evidence of chronic right L5-S1 radiculopathy, there is no evidence of active  process.  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    ------------------------------- Marcial Pacas M.D. PhD  [facility] [place], [place] [address] Tel: [phone] Fax: [phone]         [facility]    Nerve / Sites Muscle Latency Ref. Amplitude Ref. Rel Amp Segments Distance Velocity Ref. Area    ms ms mV mV %  cm m/s m/s mVms  R Peroneal - EDB     Ankle EDB 6.0 ?6.5 0.4 ?2.0 100 Ankle - EDB 9   1.4     Fib head EDB 15.9  0.4  101 Fib head - Ankle 33 33 ?44 1.0     Pop fossa EDB 18.9  0.4  94.6 Pop fossa - Fib head 10 33 ?44 0.9         Pop fossa - Ankle      L Peroneal - EDB     Ankle EDB 5.7 ?6.5 0.5 ?2.0 100 Ankle - EDB 9   2.3     Fib head EDB 14.4  0.5  97.5 Fib head - Ankle 33 38 ?44 1.2     Pop fossa EDB 17.3  0.5  109 Pop fossa - Fib head 10 35 ?44 1.3         Pop fossa - Ankle      R Tibial - AH     Ankle AH 6.7 ?5.8 4.4 ?4.0 100 Ankle - AH 9   11.8     Pop fossa AH 21.1  2.5  57 Pop fossa - Ankle 42 29 ?41 6.2  L Tibial - AH     Ankle AH 4.8 ?5.8 8.5 ?4.0 100 Ankle - AH 9   22.6     Pop fossa AH 16.5  4.9  57.9  Pop fossa - Ankle 41 35 ?41 21.6             SNC    Nerve / Sites Rec. Site Peak Lat Ref.  Amp Ref. Segments Distance    ms ms V V  cm  R Sural - Ankle (Calf)     Calf Ankle 4.1 ?4.4 12 ?6 Calf - Ankle 14  L Sural - Ankle (Calf)     Calf Ankle 4.4 ?4.4 9 ?6 Calf - Ankle 14  R Superficial peroneal - Ankle     Lat leg Ankle 4.8 ?4.4 1 ?6 Lat leg - Ankle 14  L Superficial peroneal - Ankle     Lat leg Ankle 4.7 ?4.4 4 ?6 Lat leg - Ankle 14             F  Wave    Nerve F Lat Ref.   ms ms  R Tibial - AH 70.9 ?56.0  L Tibial - AH 65.1 ?56.0         H Reflex    Nerve H Lat   ms   Left Right Ref.  Tibial - Soleus 38.6 30.2 ?35.0         EMG Summary Table    Spontaneous MUAP Recruitment  Muscle IA Fib PSW Fasc Other Amp Dur. Poly Pattern  R. Tibialis anterior Normal None  None None _______ Normal Normal Normal Reduced  R. Tibialis posterior Increased None None None _______ Increased Increased 1+ Reduced  R. Gastrocnemius (Medial head) Increased None None None _______ Increased Increased 1+ Reduced  R. Vastus lateralis Normal None None None _______ Normal Normal Normal Reduced  R. Lumbar paraspinals (mid) Increased None None None _______ Decreased Increased 1+ Normal  R. Lumbar paraspinals (low) Increased None None None _______ Increased Decreased 1+ Normal  L. Tibialis anterior Normal None None None _______ Normal Normal Normal Normal  L. Tibialis posterior Normal None None None _______ Normal Normal Normal Normal  L. Peroneus longus Normal None None None _______ Normal Normal Normal Normal  L. Gastrocnemius (Medial head) Normal None None None _______ Normal Normal Normal Normal  L. Vastus lateralis Normal None None None _______ Normal Normal Normal Normal  L. Lumbar paraspinals (mid) Normal None None None _______ Normal Normal Normal Normal  L. Lumbar paraspinals (low) Normal None None None _______ Normal Normal Normal Normal

## 2020-02-04 DIAGNOSIS — R29898 Other symptoms and signs involving the musculoskeletal system: Secondary | ICD-10-CM | POA: Insufficient documentation

## 2020-02-09 ENCOUNTER — Encounter: Payer: Self-pay | Admitting: Neurology

## 2020-03-05 DIAGNOSIS — M216X1 Other acquired deformities of right foot: Secondary | ICD-10-CM | POA: Diagnosis not present

## 2020-03-05 DIAGNOSIS — M25571 Pain in right ankle and joints of right foot: Secondary | ICD-10-CM | POA: Diagnosis not present

## 2020-03-05 DIAGNOSIS — M5417 Radiculopathy, lumbosacral region: Secondary | ICD-10-CM | POA: Diagnosis not present

## 2020-03-05 DIAGNOSIS — M79671 Pain in right foot: Secondary | ICD-10-CM | POA: Diagnosis not present

## 2020-04-27 ENCOUNTER — Ambulatory Visit: Payer: Self-pay | Admitting: Legal Medicine

## 2020-06-28 ENCOUNTER — Ambulatory Visit: Payer: Federal, State, Local not specified - PPO | Admitting: Legal Medicine

## 2020-06-28 ENCOUNTER — Other Ambulatory Visit: Payer: Self-pay

## 2020-06-28 ENCOUNTER — Encounter: Payer: Self-pay | Admitting: Legal Medicine

## 2020-06-28 DIAGNOSIS — Z6838 Body mass index (BMI) 38.0-38.9, adult: Secondary | ICD-10-CM | POA: Insufficient documentation

## 2020-06-28 DIAGNOSIS — E782 Mixed hyperlipidemia: Secondary | ICD-10-CM | POA: Diagnosis not present

## 2020-06-28 DIAGNOSIS — M5416 Radiculopathy, lumbar region: Secondary | ICD-10-CM | POA: Diagnosis not present

## 2020-06-28 DIAGNOSIS — F1010 Alcohol abuse, uncomplicated: Secondary | ICD-10-CM | POA: Insufficient documentation

## 2020-06-28 DIAGNOSIS — R079 Chest pain, unspecified: Secondary | ICD-10-CM | POA: Insufficient documentation

## 2020-06-28 DIAGNOSIS — E1169 Type 2 diabetes mellitus with other specified complication: Secondary | ICD-10-CM

## 2020-06-28 DIAGNOSIS — Z6839 Body mass index (BMI) 39.0-39.9, adult: Secondary | ICD-10-CM

## 2020-06-28 HISTORY — DX: Mixed hyperlipidemia: E78.2

## 2020-06-28 HISTORY — DX: Radiculopathy, lumbar region: M54.16

## 2020-06-28 HISTORY — DX: Morbid (severe) obesity due to excess calories: E66.01

## 2020-06-28 HISTORY — DX: Type 2 diabetes mellitus with other specified complication: E11.69

## 2020-06-28 LAB — POCT UA - MICROALBUMIN: Microalbumin Ur, POC: 80 mg/L

## 2020-06-28 MED ORDER — METFORMIN HCL 500 MG PO TABS: 500.0000 mg | ORAL_TABLET | Freq: Every day | ORAL | 2 refills | Status: DC

## 2020-06-28 MED ORDER — OXYCODONE HCL 10 MG PO TABS: 10.0000 mg | ORAL_TABLET | Freq: Three times a day (TID) | ORAL | 0 refills | Status: DC | PRN
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
## 2020-06-29 LAB — COMPREHENSIVE METABOLIC PANEL
ALT: 27 IU/L (ref 0–44)
AST: 21 IU/L (ref 0–40)
Albumin/Globulin Ratio: 1.7 (ref 1.2–2.2)
Albumin: 4.7 g/dL (ref 3.8–4.9)
Alkaline Phosphatase: 79 IU/L (ref 48–121)
BUN/Creatinine Ratio: 18 (ref 9–20)
BUN: 15 mg/dL (ref 6–24)
Bilirubin Total: 0.5 mg/dL (ref 0.0–1.2)
CO2: 23 mmol/L (ref 20–29)
Calcium: 9.7 mg/dL (ref 8.7–10.2)
Chloride: 102 mmol/L (ref 96–106)
Creatinine, Ser: 0.85 mg/dL (ref 0.76–1.27)
GFR calc Af Amer: 114 mL/min/{1.73_m2} (ref >59)
GFR calc non Af Amer: 99 mL/min/{1.73_m2} (ref >59)
Globulin, Total: 2.7 g/dL (ref 1.5–4.5)
Glucose: 145 mg/dL — ABNORMAL HIGH (ref 65–99)
Potassium: 5.2 mmol/L (ref 3.5–5.2)
Sodium: 139 mmol/L (ref 134–144)
Total Protein: 7.4 g/dL (ref 6.0–8.5)

## 2020-06-29 LAB — CBC WITH DIFFERENTIAL/PLATELET
Basophils Absolute: 0.1 10*3/uL (ref 0.0–0.2)
Basos: 1 %
EOS (ABSOLUTE): 0.3 10*3/uL (ref 0.0–0.4)
Eos: 3 %
Hematocrit: 46.1 % (ref 37.5–51.0)
Hemoglobin: 15.4 g/dL (ref 13.0–17.7)
Immature Grans (Abs): 0.1 10*3/uL (ref 0.0–0.1)
Immature Granulocytes: 1 %
Lymphocytes Absolute: 1.6 10*3/uL (ref 0.7–3.1)
Lymphs: 20 %
MCH: 29.4 pg (ref 26.6–33.0)
MCHC: 33.4 g/dL (ref 31.5–35.7)
MCV: 88 fL (ref 79–97)
Monocytes Absolute: 0.6 10*3/uL (ref 0.1–0.9)
Monocytes: 8 %
Neutrophils Absolute: 5.2 10*3/uL (ref 1.4–7.0)
Neutrophils: 67 %
Platelets: 315 10*3/uL (ref 150–450)
RBC: 5.24 x10E6/uL (ref 4.14–5.80)
RDW: 13.2 % (ref 11.6–15.4)
WBC: 7.8 10*3/uL (ref 3.4–10.8)

## 2020-06-29 LAB — HEMOGLOBIN A1C
Est. average glucose Bld gHb Est-mCnc: 154 mg/dL
Hgb A1c MFr Bld: 7 % — ABNORMAL HIGH (ref 4.8–5.6)

## 2020-06-29 LAB — LIPID PANEL
Chol/HDL Ratio: 2.9 ratio (ref 0.0–5.0)
Cholesterol, Total: 217 mg/dL — ABNORMAL HIGH (ref 100–199)
HDL: 75 mg/dL (ref >39)
LDL Chol Calc (NIH): 133 mg/dL — ABNORMAL HIGH (ref 0–99)
Triglycerides: 54 mg/dL (ref 0–149)
VLDL Cholesterol Cal: 9 mg/dL (ref 5–40)

## 2020-06-29 LAB — CARDIOVASCULAR RISK ASSESSMENT

## 2020-06-29 LAB — TSH: TSH: 2.02 u[IU]/mL (ref 0.450–4.500)

## 2020-06-29 NOTE — Progress Notes (Signed)
CBC normal, glucose 145, kidney and liver tests normal, Cholesterol high133 LDL he is at risk for cardiac disease, recommend low cholesterol diet and starting statin especially with diabetes, A1c 7.0, TSH 2.02 normal lp

## 2020-10-28 ENCOUNTER — Ambulatory Visit: Payer: Federal, State, Local not specified - PPO | Admitting: Legal Medicine

## 2020-10-28 ENCOUNTER — Other Ambulatory Visit: Payer: Self-pay

## 2020-10-28 ENCOUNTER — Encounter: Payer: Self-pay | Admitting: Legal Medicine

## 2020-10-28 VITALS — BP 120/80 | HR 48 | Temp 97.5°F | Resp 16 | Ht 75.0 in | Wt 307.6 lb

## 2020-10-28 DIAGNOSIS — Z1211 Encounter for screening for malignant neoplasm of colon: Secondary | ICD-10-CM

## 2020-10-28 DIAGNOSIS — M5416 Radiculopathy, lumbar region: Secondary | ICD-10-CM

## 2020-10-28 DIAGNOSIS — E782 Mixed hyperlipidemia: Secondary | ICD-10-CM | POA: Diagnosis not present

## 2020-10-28 DIAGNOSIS — Z6838 Body mass index (BMI) 38.0-38.9, adult: Secondary | ICD-10-CM

## 2020-10-28 DIAGNOSIS — E1169 Type 2 diabetes mellitus with other specified complication: Secondary | ICD-10-CM | POA: Diagnosis not present

## 2020-10-28 DIAGNOSIS — N400 Enlarged prostate without lower urinary tract symptoms: Secondary | ICD-10-CM | POA: Diagnosis not present

## 2020-10-28 MED ORDER — OXYCODONE HCL 10 MG PO TABS: 10.0000 mg | ORAL_TABLET | Freq: Three times a day (TID) | ORAL | 0 refills | Status: DC | PRN
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
## 2020-10-29 LAB — COMPREHENSIVE METABOLIC PANEL
ALT: 34 IU/L (ref 0–44)
AST: 30 IU/L (ref 0–40)
Albumin/Globulin Ratio: 1.8 (ref 1.2–2.2)
Albumin: 4.8 g/dL (ref 3.8–4.9)
Alkaline Phosphatase: 74 IU/L (ref 44–121)
BUN/Creatinine Ratio: 23 — ABNORMAL HIGH (ref 9–20)
BUN: 19 mg/dL (ref 6–24)
Bilirubin Total: 0.4 mg/dL (ref 0.0–1.2)
CO2: 23 mmol/L (ref 20–29)
Calcium: 9.5 mg/dL (ref 8.7–10.2)
Chloride: 102 mmol/L (ref 96–106)
Creatinine, Ser: 0.82 mg/dL (ref 0.76–1.27)
GFR calc Af Amer: 116 mL/min/{1.73_m2} (ref >59)
GFR calc non Af Amer: 100 mL/min/{1.73_m2} (ref >59)
Globulin, Total: 2.7 g/dL (ref 1.5–4.5)
Glucose: 144 mg/dL — ABNORMAL HIGH (ref 65–99)
Potassium: 4.7 mmol/L (ref 3.5–5.2)
Sodium: 138 mmol/L (ref 134–144)
Total Protein: 7.5 g/dL (ref 6.0–8.5)

## 2020-10-29 LAB — LIPID PANEL
Chol/HDL Ratio: 2.4 ratio (ref 0.0–5.0)
Cholesterol, Total: 212 mg/dL — ABNORMAL HIGH (ref 100–199)
HDL: 87 mg/dL (ref >39)
LDL Chol Calc (NIH): 117 mg/dL — ABNORMAL HIGH (ref 0–99)
Triglycerides: 45 mg/dL (ref 0–149)
VLDL Cholesterol Cal: 8 mg/dL (ref 5–40)

## 2020-10-29 LAB — PSA: Prostate Specific Ag, Serum: 0.5 ng/mL (ref 0.0–4.0)

## 2020-10-29 LAB — HEMOGLOBIN A1C
Est. average glucose Bld gHb Est-mCnc: 154 mg/dL
Hgb A1c MFr Bld: 7 % — ABNORMAL HIGH (ref 4.8–5.6)

## 2020-10-29 LAB — CBC WITH DIFFERENTIAL/PLATELET
Basophils Absolute: 0.1 10*3/uL (ref 0.0–0.2)
Basos: 1 %
EOS (ABSOLUTE): 0.2 10*3/uL (ref 0.0–0.4)
Eos: 3 %
Hematocrit: 43.2 % (ref 37.5–51.0)
Hemoglobin: 14.6 g/dL (ref 13.0–17.7)
Immature Grans (Abs): 0 10*3/uL (ref 0.0–0.1)
Immature Granulocytes: 1 %
Lymphocytes Absolute: 1.6 10*3/uL (ref 0.7–3.1)
Lymphs: 20 %
MCH: 29.6 pg (ref 26.6–33.0)
MCHC: 33.8 g/dL (ref 31.5–35.7)
MCV: 87 fL (ref 79–97)
Monocytes Absolute: 0.6 10*3/uL (ref 0.1–0.9)
Monocytes: 7 %
Neutrophils Absolute: 5.3 10*3/uL (ref 1.4–7.0)
Neutrophils: 68 %
Platelets: 306 10*3/uL (ref 150–450)
RBC: 4.94 x10E6/uL (ref 4.14–5.80)
RDW: 13.4 % (ref 11.6–15.4)
WBC: 7.8 10*3/uL (ref 3.4–10.8)

## 2020-10-29 LAB — CARDIOVASCULAR RISK ASSESSMENT

## 2020-10-29 NOTE — Progress Notes (Signed)
PSA 0.5 normal, glucose 144 high, kidney tests ok, liver tests ok, A1c 7.0 goodLDL cholesterol remains high 117 , cbc normal lp

## 2020-11-15 DIAGNOSIS — Z1211 Encounter for screening for malignant neoplasm of colon: Secondary | ICD-10-CM | POA: Diagnosis not present

## 2020-12-31 ENCOUNTER — Other Ambulatory Visit: Payer: Self-pay | Admitting: Legal Medicine

## 2021-01-27 ENCOUNTER — Ambulatory Visit: Payer: Federal, State, Local not specified - PPO | Admitting: Legal Medicine

## 2021-03-26 ENCOUNTER — Other Ambulatory Visit: Payer: Self-pay | Admitting: Legal Medicine

## 2021-03-26 DIAGNOSIS — E1169 Type 2 diabetes mellitus with other specified complication: Secondary | ICD-10-CM

## 2021-04-28 ENCOUNTER — Other Ambulatory Visit: Payer: Self-pay

## 2021-04-28 ENCOUNTER — Encounter: Payer: Self-pay | Admitting: Legal Medicine

## 2021-04-28 ENCOUNTER — Other Ambulatory Visit: Payer: Self-pay | Admitting: Legal Medicine

## 2021-04-28 ENCOUNTER — Ambulatory Visit: Payer: Federal, State, Local not specified - PPO | Admitting: Legal Medicine

## 2021-04-28 VITALS — BP 130/60 | HR 60 | Temp 97.8°F | Resp 16 | Ht 75.0 in | Wt 308.0 lb

## 2021-04-28 DIAGNOSIS — E1169 Type 2 diabetes mellitus with other specified complication: Secondary | ICD-10-CM | POA: Diagnosis not present

## 2021-04-28 DIAGNOSIS — E782 Mixed hyperlipidemia: Secondary | ICD-10-CM | POA: Diagnosis not present

## 2021-04-28 DIAGNOSIS — N400 Enlarged prostate without lower urinary tract symptoms: Secondary | ICD-10-CM

## 2021-04-28 DIAGNOSIS — M5416 Radiculopathy, lumbar region: Secondary | ICD-10-CM

## 2021-04-28 DIAGNOSIS — Z6838 Body mass index (BMI) 38.0-38.9, adult: Secondary | ICD-10-CM

## 2021-04-28 LAB — POCT UA - MICROALBUMIN: Microalbumin Ur, POC: 30 mg/L

## 2021-04-28 MED ORDER — OXYCODONE HCL 10 MG PO TABS: 10.0000 mg | ORAL_TABLET | Freq: Three times a day (TID) | ORAL | 0 refills | Status: DC | PRN
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
## 2021-04-29 LAB — CBC WITH DIFFERENTIAL/PLATELET
Basophils Absolute: 0.1 10*3/uL (ref 0.0–0.2)
Basos: 1 %
EOS (ABSOLUTE): 0.2 10*3/uL (ref 0.0–0.4)
Eos: 3 %
Hematocrit: 44.9 % (ref 37.5–51.0)
Hemoglobin: 15.1 g/dL (ref 13.0–17.7)
Immature Grans (Abs): 0 10*3/uL (ref 0.0–0.1)
Immature Granulocytes: 0 %
Lymphocytes Absolute: 1.5 10*3/uL (ref 0.7–3.1)
Lymphs: 21 %
MCH: 29.7 pg (ref 26.6–33.0)
MCHC: 33.6 g/dL (ref 31.5–35.7)
MCV: 88 fL (ref 79–97)
Monocytes Absolute: 0.6 10*3/uL (ref 0.1–0.9)
Monocytes: 8 %
Neutrophils Absolute: 5.1 10*3/uL (ref 1.4–7.0)
Neutrophils: 67 %
Platelets: 299 10*3/uL (ref 150–450)
RBC: 5.09 x10E6/uL (ref 4.14–5.80)
RDW: 13.6 % (ref 11.6–15.4)
WBC: 7.5 10*3/uL (ref 3.4–10.8)

## 2021-04-29 LAB — HEMOGLOBIN A1C
Est. average glucose Bld gHb Est-mCnc: 154 mg/dL
Hgb A1c MFr Bld: 7 % — ABNORMAL HIGH (ref 4.8–5.6)

## 2021-04-29 LAB — COMPREHENSIVE METABOLIC PANEL
ALT: 32 IU/L (ref 0–44)
AST: 22 IU/L (ref 0–40)
Albumin/Globulin Ratio: 1.7 (ref 1.2–2.2)
Albumin: 4.7 g/dL (ref 3.8–4.9)
Alkaline Phosphatase: 67 IU/L (ref 44–121)
BUN/Creatinine Ratio: 21 — ABNORMAL HIGH (ref 9–20)
BUN: 17 mg/dL (ref 6–24)
Bilirubin Total: 0.5 mg/dL (ref 0.0–1.2)
CO2: 23 mmol/L (ref 20–29)
Calcium: 9.7 mg/dL (ref 8.7–10.2)
Chloride: 102 mmol/L (ref 96–106)
Creatinine, Ser: 0.82 mg/dL (ref 0.76–1.27)
Globulin, Total: 2.7 g/dL (ref 1.5–4.5)
Glucose: 133 mg/dL — ABNORMAL HIGH (ref 65–99)
Potassium: 5 mmol/L (ref 3.5–5.2)
Sodium: 140 mmol/L (ref 134–144)
Total Protein: 7.4 g/dL (ref 6.0–8.5)
eGFR: 104 mL/min/{1.73_m2} (ref >59)

## 2021-04-29 LAB — LIPID PANEL
Chol/HDL Ratio: 2.6 ratio (ref 0.0–5.0)
Cholesterol, Total: 213 mg/dL — ABNORMAL HIGH (ref 100–199)
HDL: 83 mg/dL (ref >39)
LDL Chol Calc (NIH): 122 mg/dL — ABNORMAL HIGH (ref 0–99)
Triglycerides: 47 mg/dL (ref 0–149)
VLDL Cholesterol Cal: 8 mg/dL (ref 5–40)

## 2021-04-29 LAB — CARDIOVASCULAR RISK ASSESSMENT

## 2021-04-29 LAB — PSA: Prostate Specific Ag, Serum: 0.6 ng/mL (ref 0.0–4.0)

## 2021-04-29 NOTE — Progress Notes (Signed)
Glucose 133, kidney tests normal, liver tests normal, A1c 7.0 good, LDLcholesterol 122 high- really need a statin to lower cardiovascular disease, cbc normal, PSA 0.6 normal,  lp

## 2021-09-28 ENCOUNTER — Encounter: Payer: Self-pay | Admitting: Legal Medicine

## 2021-09-28 ENCOUNTER — Telehealth (INDEPENDENT_AMBULATORY_CARE_PROVIDER_SITE_OTHER): Payer: Federal, State, Local not specified - PPO | Admitting: Legal Medicine

## 2021-09-28 VITALS — Ht 75.0 in | Wt 308.0 lb

## 2021-09-28 DIAGNOSIS — J0110 Acute frontal sinusitis, unspecified: Secondary | ICD-10-CM | POA: Diagnosis not present

## 2021-09-28 DIAGNOSIS — U071 COVID-19: Secondary | ICD-10-CM

## 2021-09-28 MED ORDER — MOLNUPIRAVIR EUA 200MG CAPSULE: 4.0000 | ORAL_CAPSULE | Freq: Two times a day (BID) | ORAL | 0 refills | Status: AC

## 2021-09-28 MED ORDER — AZITHROMYCIN 250 MG PO TABS: ORAL_TABLET | ORAL | 0 refills | Status: AC
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
## 2021-10-28 ENCOUNTER — Other Ambulatory Visit: Payer: Self-pay

## 2021-10-28 ENCOUNTER — Encounter: Payer: Self-pay | Admitting: Legal Medicine

## 2021-10-28 ENCOUNTER — Ambulatory Visit: Payer: Federal, State, Local not specified - PPO | Admitting: Legal Medicine

## 2021-10-28 VITALS — BP 122/70 | HR 55 | Temp 97.2°F | Ht 75.0 in | Wt 304.0 lb

## 2021-10-28 DIAGNOSIS — M5416 Radiculopathy, lumbar region: Secondary | ICD-10-CM

## 2021-10-28 DIAGNOSIS — E1169 Type 2 diabetes mellitus with other specified complication: Secondary | ICD-10-CM | POA: Diagnosis not present

## 2021-10-28 DIAGNOSIS — Z6838 Body mass index (BMI) 38.0-38.9, adult: Secondary | ICD-10-CM

## 2021-10-28 DIAGNOSIS — E782 Mixed hyperlipidemia: Secondary | ICD-10-CM

## 2021-10-28 MED ORDER — OXYCODONE HCL 10 MG PO TABS: 10.0000 mg | ORAL_TABLET | Freq: Three times a day (TID) | ORAL | 0 refills | Status: DC | PRN
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
## 2021-10-29 LAB — COMPREHENSIVE METABOLIC PANEL
ALT: 28 IU/L (ref 0–44)
AST: 23 IU/L (ref 0–40)
Albumin/Globulin Ratio: 2 (ref 1.2–2.2)
Albumin: 4.9 g/dL (ref 3.8–4.9)
Alkaline Phosphatase: 73 IU/L (ref 44–121)
BUN/Creatinine Ratio: 19 (ref 9–20)
BUN: 16 mg/dL (ref 6–24)
Bilirubin Total: 0.4 mg/dL (ref 0.0–1.2)
CO2: 23 mmol/L (ref 20–29)
Calcium: 9.4 mg/dL (ref 8.7–10.2)
Chloride: 102 mmol/L (ref 96–106)
Creatinine, Ser: 0.83 mg/dL (ref 0.76–1.27)
Globulin, Total: 2.4 g/dL (ref 1.5–4.5)
Glucose: 137 mg/dL — ABNORMAL HIGH (ref 70–99)
Potassium: 4.5 mmol/L (ref 3.5–5.2)
Sodium: 139 mmol/L (ref 134–144)
Total Protein: 7.3 g/dL (ref 6.0–8.5)
eGFR: 103 mL/min/{1.73_m2} (ref >59)

## 2021-10-29 LAB — LIPID PANEL
Chol/HDL Ratio: 2.8 ratio (ref 0.0–5.0)
Cholesterol, Total: 213 mg/dL — ABNORMAL HIGH (ref 100–199)
HDL: 77 mg/dL (ref >39)
LDL Chol Calc (NIH): 129 mg/dL — ABNORMAL HIGH (ref 0–99)
Triglycerides: 41 mg/dL (ref 0–149)
VLDL Cholesterol Cal: 7 mg/dL (ref 5–40)

## 2021-10-29 LAB — CBC WITH DIFFERENTIAL/PLATELET
Basophils Absolute: 0.1 10*3/uL (ref 0.0–0.2)
Basos: 1 %
EOS (ABSOLUTE): 0.2 10*3/uL (ref 0.0–0.4)
Eos: 3 %
Hematocrit: 46 % (ref 37.5–51.0)
Hemoglobin: 15.3 g/dL (ref 13.0–17.7)
Immature Grans (Abs): 0 10*3/uL (ref 0.0–0.1)
Immature Granulocytes: 1 %
Lymphocytes Absolute: 1.7 10*3/uL (ref 0.7–3.1)
Lymphs: 22 %
MCH: 29.1 pg (ref 26.6–33.0)
MCHC: 33.3 g/dL (ref 31.5–35.7)
MCV: 88 fL (ref 79–97)
Monocytes Absolute: 0.6 10*3/uL (ref 0.1–0.9)
Monocytes: 8 %
Neutrophils Absolute: 5 10*3/uL (ref 1.4–7.0)
Neutrophils: 65 %
Platelets: 267 10*3/uL (ref 150–450)
RBC: 5.25 x10E6/uL (ref 4.14–5.80)
RDW: 13.2 % (ref 11.6–15.4)
WBC: 7.6 10*3/uL (ref 3.4–10.8)

## 2021-10-29 LAB — HEMOGLOBIN A1C
Est. average glucose Bld gHb Est-mCnc: 160 mg/dL
Hgb A1c MFr Bld: 7.2 % — ABNORMAL HIGH (ref 4.8–5.6)

## 2021-10-29 LAB — CARDIOVASCULAR RISK ASSESSMENT

## 2021-10-30 ENCOUNTER — Encounter: Payer: Self-pay | Admitting: Legal Medicine

## 2021-10-30 NOTE — Progress Notes (Signed)
CBC normal, glucose 137, kidney tests normal, liver tests normal, A1c 7.2, LDL cholesterol high 129 need to lower risk from cardiovascular disease, consider statin or zetia, possible IM praulent  lp

## 2021-12-23 ENCOUNTER — Other Ambulatory Visit: Payer: Self-pay | Admitting: Legal Medicine

## 2021-12-23 DIAGNOSIS — E1169 Type 2 diabetes mellitus with other specified complication: Secondary | ICD-10-CM

## 2022-02-10 ENCOUNTER — Encounter: Payer: Self-pay | Admitting: Nurse Practitioner

## 2022-02-10 ENCOUNTER — Other Ambulatory Visit: Payer: Self-pay

## 2022-02-10 ENCOUNTER — Ambulatory Visit: Payer: Federal, State, Local not specified - PPO | Admitting: Nurse Practitioner

## 2022-02-10 VITALS — BP 136/82 | HR 87 | Temp 97.6°F | Ht 75.0 in | Wt 309.0 lb

## 2022-02-10 DIAGNOSIS — R0981 Nasal congestion: Secondary | ICD-10-CM | POA: Diagnosis not present

## 2022-02-10 DIAGNOSIS — J0181 Other acute recurrent sinusitis: Secondary | ICD-10-CM

## 2022-02-10 DIAGNOSIS — M5416 Radiculopathy, lumbar region: Secondary | ICD-10-CM

## 2022-02-10 LAB — POC COVID19 BINAXNOW: SARS Coronavirus 2 Ag: NEGATIVE

## 2022-02-10 MED ORDER — OXYCODONE HCL 10 MG PO TABS: 10.0000 mg | ORAL_TABLET | Freq: Three times a day (TID) | ORAL | 0 refills | Status: DC | PRN

## 2022-02-10 MED ORDER — AZITHROMYCIN 250 MG PO TABS: ORAL_TABLET | ORAL | 0 refills | Status: AC

## 2022-02-10 MED ORDER — FLUTICASONE PROPIONATE 50 MCG/ACT NA SUSP: 2.0000 | Freq: Every day | NASAL | 6 refills | Status: DC

## 2022-02-10 NOTE — Patient Instructions (Addendum)
Flonase nasal spray daily ?Z-pack as directed ?Follow-up as needed ? ? ? ?Sinusitis, Adult ?Sinusitis is soreness and swelling (inflammation) of your sinuses. Sinuses are hollow spaces in the bones around your face. They are located: ?Around your eyes. ?In the middle of your forehead. ?Behind your nose. ?In your cheekbones. ?Your sinuses and nasal passages are lined with a fluid called mucus. Mucus drains out of your sinuses. Swelling can trap mucus in your sinuses. This lets germs (bacteria, virus, or fungus) grow, which leads to infection. Most of the time, this condition is caused by a virus. ?What are the causes? ?This condition is caused by: ?Allergies. ?Asthma. ?Germs. ?Things that block your nose or sinuses. ?Growths in the nose (nasal polyps). ?Chemicals or irritants in the air. ?Fungus (rare). ?What increases the risk? ?You are more likely to develop this condition if: ?You have a weak body defense system (immune system). ?You do a lot of swimming or diving. ?You use nasal sprays too much. ?You smoke. ?What are the signs or symptoms? ?The main symptoms of this condition are pain and a feeling of pressure around the sinuses. Other symptoms include: ?Stuffy nose (congestion). ?Runny nose (drainage). ?Swelling and warmth in the sinuses. ?Headache. ?Toothache. ?A cough that may get worse at night. ?Mucus that collects in the throat or the back of the nose (postnasal drip). ?Being unable to smell and taste. ?Being very tired (fatigue). ?A fever. ?Sore throat. ?Bad breath. ?How is this diagnosed? ?This condition is diagnosed based on: ?Your symptoms. ?Your medical history. ?A physical exam. ?Tests to find out if your condition is short-term (acute) or [place]-term (chronic). Your doctor may: ?Check your nose for growths (polyps). ?Check your sinuses using a tool that has a light (endoscope). ?Check for allergies or germs. ?Do imaging tests, such as an MRI or CT scan. ?How is this treated? ?Treatment for this  condition depends on the cause and whether it is short-term or [place]-term. ?If caused by a virus, your symptoms should go away on their own within 10 days. You may be given medicines to relieve symptoms. They include: ?Medicines that shrink swollen tissue in the nose. ?Medicines that treat allergies (antihistamines). ?A spray that treats swelling of the nostrils.  ?Rinses that help get rid of thick mucus in your nose (nasal saline washes). ?If caused by bacteria, your doctor may wait to see if you will get better without treatment. You may be given antibiotic medicine if you have: ?A very bad infection. ?A weak body defense system. ?If caused by growths in the nose, you may need to have surgery. ?Follow these instructions at home: ?Medicines ?Take, use, or apply over-the-counter and prescription medicines only as told by your doctor. These may include nasal sprays. ?If you were prescribed an antibiotic medicine, take it as told by your doctor. Do not stop taking the antibiotic even if you start to feel better. ?Hydrate and humidify ? ?Drink enough water to keep your pee (urine) pale yellow. ?Use a cool mist humidifier to keep the humidity level in your home above 50%. ?Breathe in steam for 10-15 minutes, 3-4 times a day, or as told by your doctor. You can do this in the bathroom while a hot shower is running. ?Try not to spend time in cool or dry air. ?Rest ?Rest as much as you can. ?Sleep with your head raised (elevated). ?Make sure you get enough sleep each night. ?General instructions ? ?Put a warm, moist washcloth on your face 3-4 times a day,  or as often as told by your doctor. This will help with discomfort. ?Wash your hands often with soap and water. If there is no soap and water, use hand sanitizer. ?Do not smoke. Avoid being around people who are smoking (secondhand smoke). ?Keep all follow-up visits as told by your doctor. This is important. ?Contact a doctor if: ?You have a fever. ?Your symptoms get  worse. ?Your symptoms do not get better within 10 days. ?Get help right away if: ?You have a very bad headache. ?You cannot stop throwing up (vomiting). ?You have very bad pain or swelling around your face or eyes. ?You have trouble seeing. ?You feel confused. ?Your neck is stiff. ?You have trouble breathing. ?Summary ?Sinusitis is swelling of your sinuses. Sinuses are hollow spaces in the bones around your face. ?This condition is caused by tissues in your nose that become inflamed or swollen. This traps germs. These can lead to infection. ?If you were prescribed an antibiotic medicine, take it as told by your doctor. Do not stop taking it even if you start to feel better. ?Keep all follow-up visits as told by your doctor. This is important. ?This information is not intended to replace advice given to you by your health care provider. Make sure you discuss any questions you have with your health care provider. ?Document Revised: 04/15/2018 Document Reviewed: 04/15/2018 ?Elsevier Patient Education ? [place]. ? ?
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
## 2022-05-02 ENCOUNTER — Ambulatory Visit: Payer: Federal, State, Local not specified - PPO | Admitting: Legal Medicine

## 2022-05-02 ENCOUNTER — Encounter: Payer: Self-pay | Admitting: Legal Medicine

## 2022-05-02 VITALS — BP 134/78 | HR 68 | Temp 97.2°F | Ht 75.0 in | Wt 306.0 lb

## 2022-05-02 DIAGNOSIS — E1169 Type 2 diabetes mellitus with other specified complication: Secondary | ICD-10-CM | POA: Diagnosis not present

## 2022-05-02 DIAGNOSIS — G894 Chronic pain syndrome: Secondary | ICD-10-CM

## 2022-05-02 DIAGNOSIS — E782 Mixed hyperlipidemia: Secondary | ICD-10-CM | POA: Diagnosis not present

## 2022-05-02 DIAGNOSIS — Z6838 Body mass index (BMI) 38.0-38.9, adult: Secondary | ICD-10-CM

## 2022-05-02 DIAGNOSIS — G8929 Other chronic pain: Secondary | ICD-10-CM | POA: Insufficient documentation

## 2022-05-02 DIAGNOSIS — M5416 Radiculopathy, lumbar region: Secondary | ICD-10-CM

## 2022-05-02 MED ORDER — OXYCODONE HCL 10 MG PO TABS: 10.0000 mg | ORAL_TABLET | Freq: Three times a day (TID) | ORAL | 0 refills | Status: DC | PRN
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
## 2022-05-02 NOTE — Assessment & Plan Note (Signed)
2014

## 2022-05-03 LAB — COMPREHENSIVE METABOLIC PANEL
ALT: 33 IU/L (ref 0–44)
AST: 21 IU/L (ref 0–40)
Albumin/Globulin Ratio: 2 (ref 1.2–2.2)
Albumin: 4.6 g/dL (ref 3.8–4.9)
Alkaline Phosphatase: 68 IU/L (ref 44–121)
BUN/Creatinine Ratio: 22 — ABNORMAL HIGH (ref 9–20)
BUN: 19 mg/dL (ref 6–24)
Bilirubin Total: 0.5 mg/dL (ref 0.0–1.2)
CO2: 22 mmol/L (ref 20–29)
Calcium: 9.3 mg/dL (ref 8.7–10.2)
Chloride: 102 mmol/L (ref 96–106)
Creatinine, Ser: 0.86 mg/dL (ref 0.76–1.27)
Globulin, Total: 2.3 g/dL (ref 1.5–4.5)
Glucose: 136 mg/dL — ABNORMAL HIGH (ref 70–99)
Potassium: 4.5 mmol/L (ref 3.5–5.2)
Sodium: 139 mmol/L (ref 134–144)
Total Protein: 6.9 g/dL (ref 6.0–8.5)
eGFR: 102 mL/min/{1.73_m2} (ref >59)

## 2022-05-03 LAB — LIPID PANEL
Chol/HDL Ratio: 2.8 ratio (ref 0.0–5.0)
Cholesterol, Total: 229 mg/dL — ABNORMAL HIGH (ref 100–199)
HDL: 83 mg/dL (ref >39)
LDL Chol Calc (NIH): 139 mg/dL — ABNORMAL HIGH (ref 0–99)
Triglycerides: 43 mg/dL (ref 0–149)
VLDL Cholesterol Cal: 7 mg/dL (ref 5–40)

## 2022-05-03 LAB — HEMOGLOBIN A1C
Est. average glucose Bld gHb Est-mCnc: 166 mg/dL
Hgb A1c MFr Bld: 7.4 % — ABNORMAL HIGH (ref 4.8–5.6)

## 2022-05-03 LAB — CARDIOVASCULAR RISK ASSESSMENT

## 2022-05-03 NOTE — Progress Notes (Signed)
Glucose 136, kidney tests normal, Liver tests normal, A1c 7.4 good, LDL cholesterol 139 high suggest statin, high risk of CV death,  lp

## 2022-06-28 ENCOUNTER — Ambulatory Visit: Payer: Federal, State, Local not specified - PPO | Admitting: Legal Medicine

## 2022-06-28 VITALS — BP 140/80 | HR 56 | Temp 98.8°F | Resp 15 | Ht 75.0 in | Wt 294.0 lb

## 2022-06-28 DIAGNOSIS — R599 Enlarged lymph nodes, unspecified: Secondary | ICD-10-CM

## 2022-06-28 DIAGNOSIS — A09 Infectious gastroenteritis and colitis, unspecified: Secondary | ICD-10-CM

## 2022-06-28 DIAGNOSIS — R197 Diarrhea, unspecified: Secondary | ICD-10-CM | POA: Insufficient documentation

## 2022-06-28 DIAGNOSIS — E1169 Type 2 diabetes mellitus with other specified complication: Secondary | ICD-10-CM | POA: Diagnosis not present

## 2022-06-28 MED ORDER — SULFAMETHOXAZOLE-TRIMETHOPRIM 800-160 MG PO TABS: 1.0000 | ORAL_TABLET | Freq: Two times a day (BID) | ORAL | 0 refills | Status: DC
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
## 2022-06-29 DIAGNOSIS — A09 Infectious gastroenteritis and colitis, unspecified: Secondary | ICD-10-CM | POA: Diagnosis not present

## 2022-06-29 LAB — CBC WITH DIFFERENTIAL/PLATELET
Basophils Absolute: 0.1 10*3/uL (ref 0.0–0.2)
Basos: 1 %
EOS (ABSOLUTE): 0.2 10*3/uL (ref 0.0–0.4)
Eos: 2 %
Hematocrit: 42.9 % (ref 37.5–51.0)
Hemoglobin: 14.4 g/dL (ref 13.0–17.7)
Immature Grans (Abs): 0 10*3/uL (ref 0.0–0.1)
Immature Granulocytes: 0 %
Lymphocytes Absolute: 1.8 10*3/uL (ref 0.7–3.1)
Lymphs: 18 %
MCH: 29.9 pg (ref 26.6–33.0)
MCHC: 33.6 g/dL (ref 31.5–35.7)
MCV: 89 fL (ref 79–97)
Monocytes Absolute: 0.9 10*3/uL (ref 0.1–0.9)
Monocytes: 9 %
Neutrophils Absolute: 7.1 10*3/uL — ABNORMAL HIGH (ref 1.4–7.0)
Neutrophils: 70 %
Platelets: 325 10*3/uL (ref 150–450)
RBC: 4.81 x10E6/uL (ref 4.14–5.80)
RDW: 12.8 % (ref 11.6–15.4)
WBC: 10.1 10*3/uL (ref 3.4–10.8)

## 2022-06-29 LAB — BASIC METABOLIC PANEL
BUN/Creatinine Ratio: 20 (ref 9–20)
BUN: 18 mg/dL (ref 6–24)
CO2: 23 mmol/L (ref 20–29)
Calcium: 10.1 mg/dL (ref 8.7–10.2)
Chloride: 101 mmol/L (ref 96–106)
Creatinine, Ser: 0.88 mg/dL (ref 0.76–1.27)
Glucose: 98 mg/dL (ref 70–99)
Potassium: 4.9 mmol/L (ref 3.5–5.2)
Sodium: 139 mmol/L (ref 134–144)
eGFR: 101 mL/min/{1.73_m2} (ref >59)

## 2022-06-29 LAB — MICROALBUMIN / CREATININE URINE RATIO
Creatinine, Urine: 115.8 mg/dL
Microalb/Creat Ratio: 9 mg/g creat (ref 0–29)
Microalbumin, Urine: 10.2 ug/mL

## 2022-06-29 MED ORDER — OMEGA-3-ACID ETHYL ESTERS 1 G PO CAPS: 2.0000 g | ORAL_CAPSULE | Freq: Two times a day (BID) | ORAL | 2 refills | Status: DC

## 2022-06-29 NOTE — Progress Notes (Signed)
Cbc normal, kidney tests normal, glucose 98 lp

## 2022-06-30 NOTE — Progress Notes (Signed)
Microalbuminuria 9 normal lp

## 2022-07-02 ENCOUNTER — Encounter: Payer: Self-pay | Admitting: Legal Medicine

## 2022-07-03 LAB — CDIFF NAA+O+P+STOOL CULTURE
E coli, Shiga toxin Assay: NEGATIVE
Toxigenic C. Difficile by PCR: NEGATIVE

## 2022-07-12 ENCOUNTER — Encounter: Payer: Self-pay | Admitting: Legal Medicine

## 2022-07-12 ENCOUNTER — Ambulatory Visit: Payer: Federal, State, Local not specified - PPO | Admitting: Legal Medicine

## 2022-07-12 VITALS — BP 120/84 | HR 76 | Temp 98.2°F | Ht 75.0 in | Wt 290.0 lb

## 2022-07-12 DIAGNOSIS — R599 Enlarged lymph nodes, unspecified: Secondary | ICD-10-CM | POA: Diagnosis not present

## 2022-07-12 DIAGNOSIS — K580 Irritable bowel syndrome with diarrhea: Secondary | ICD-10-CM

## 2022-07-12 DIAGNOSIS — A09 Infectious gastroenteritis and colitis, unspecified: Secondary | ICD-10-CM | POA: Diagnosis not present

## 2022-07-12 MED ORDER — DICYCLOMINE HCL 20 MG PO TABS
20.0000 mg | ORAL_TABLET | Freq: Two times a day (BID) | ORAL | 2 refills | Status: DC
Start: 2022-07-12 — End: 2022-10-04
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
## 2022-08-01 ENCOUNTER — Ambulatory Visit: Payer: Self-pay

## 2022-08-01 ENCOUNTER — Encounter: Payer: Self-pay | Admitting: Sports Medicine

## 2022-08-01 ENCOUNTER — Ambulatory Visit: Payer: Federal, State, Local not specified - PPO | Admitting: Sports Medicine

## 2022-08-01 ENCOUNTER — Ambulatory Visit (INDEPENDENT_AMBULATORY_CARE_PROVIDER_SITE_OTHER): Payer: Federal, State, Local not specified - PPO

## 2022-08-01 VITALS — BP 158/75 | HR 59 | Ht 74.0 in | Wt 296.0 lb

## 2022-08-01 DIAGNOSIS — M25562 Pain in left knee: Secondary | ICD-10-CM | POA: Diagnosis not present

## 2022-08-01 DIAGNOSIS — M7632 Iliotibial band syndrome, left leg: Secondary | ICD-10-CM | POA: Diagnosis not present

## 2022-08-01 DIAGNOSIS — R0781 Pleurodynia: Secondary | ICD-10-CM

## 2022-08-01 NOTE — Progress Notes (Signed)
Left knee pain present since May of 2023. No injury that he recalls. He had similar pain in the right knee years ago which he had a successful arthroscopy done. He does complain of popping/clicking in the knee. He is wanting an MRI scan to fully evaluate it, and discuss surgical options if needed.

## 2022-08-01 NOTE — Patient Instructions (Signed)
Arad, it was great to see you today, thank you for letting me participate in your care.  Today, we discussed your left knee pain. Your exam suggests this is likely your IT band (where it attaches to your knee) or possible meniscus.   Things for you to do: -Perform home exercises once daily, every day -Ice the painful area for 15-20 minutes after activity and when sore/swollen -Obtain a knee compression sleeve to be worn with activity  You will follow-up with me in about 6 weeks.  If you have any further questions, please give the clinic a call [phone].  Madelyn Brunner, DO Primary Care Sports Medicine Physician  [facility] [place] [facility]
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
## 2022-08-16 ENCOUNTER — Ambulatory Visit: Payer: Federal, State, Local not specified - PPO | Admitting: Legal Medicine

## 2022-08-16 ENCOUNTER — Encounter: Payer: Self-pay | Admitting: Legal Medicine

## 2022-08-16 VITALS — BP 124/80 | HR 51 | Temp 98.0°F | Resp 14 | Ht 74.0 in | Wt 294.0 lb

## 2022-08-16 DIAGNOSIS — R590 Localized enlarged lymph nodes: Secondary | ICD-10-CM

## 2022-08-16 MED ORDER — SULFAMETHOXAZOLE-TRIMETHOPRIM 800-160 MG PO TABS: 1.0000 | ORAL_TABLET | Freq: Two times a day (BID) | ORAL | 0 refills | Status: DC
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
## 2022-08-22 ENCOUNTER — Encounter: Payer: Self-pay | Admitting: Oncology

## 2022-08-22 ENCOUNTER — Inpatient Hospital Stay: Payer: Federal, State, Local not specified - PPO | Attending: Oncology | Admitting: Oncology

## 2022-08-22 ENCOUNTER — Inpatient Hospital Stay: Payer: Federal, State, Local not specified - PPO

## 2022-08-22 ENCOUNTER — Other Ambulatory Visit: Payer: Self-pay

## 2022-08-22 DIAGNOSIS — E119 Type 2 diabetes mellitus without complications: Secondary | ICD-10-CM

## 2022-08-22 DIAGNOSIS — R599 Enlarged lymph nodes, unspecified: Secondary | ICD-10-CM | POA: Diagnosis not present

## 2022-08-22 DIAGNOSIS — R590 Localized enlarged lymph nodes: Secondary | ICD-10-CM | POA: Insufficient documentation

## 2022-08-22 DIAGNOSIS — Z87891 Personal history of nicotine dependence: Secondary | ICD-10-CM

## 2022-08-22 DIAGNOSIS — Z1159 Encounter for screening for other viral diseases: Secondary | ICD-10-CM | POA: Diagnosis not present

## 2022-08-22 LAB — CBC WITH DIFFERENTIAL (CANCER CENTER ONLY)
Abs Immature Granulocytes: 0.02 10*3/uL (ref 0.00–0.07)
Basophils Absolute: 0.1 10*3/uL (ref 0.0–0.1)
Basophils Relative: 1 %
Eosinophils Absolute: 0.3 10*3/uL (ref 0.0–0.5)
Eosinophils Relative: 3 %
HCT: 45.7 % (ref 39.0–52.0)
Hemoglobin: 15.1 g/dL (ref 13.0–17.0)
Immature Granulocytes: 0 %
Lymphocytes Relative: 21 %
Lymphs Abs: 1.7 10*3/uL (ref 0.7–4.0)
MCH: 29.7 pg (ref 26.0–34.0)
MCHC: 33 g/dL (ref 30.0–36.0)
MCV: 90 fL (ref 80.0–100.0)
Monocytes Absolute: 0.5 10*3/uL (ref 0.1–1.0)
Monocytes Relative: 7 %
Neutro Abs: 5.2 10*3/uL (ref 1.7–7.7)
Neutrophils Relative %: 68 %
Platelet Count: 378 10*3/uL (ref 150–400)
RBC: 5.08 MIL/uL (ref 4.22–5.81)
RDW: 14 % (ref 11.5–15.5)
WBC Count: 7.8 10*3/uL (ref 4.0–10.5)
nRBC: 0 % (ref 0.0–0.2)

## 2022-08-22 LAB — HEPATITIS PANEL, ACUTE
HCV Ab: NONREACTIVE
Hep A IgM: NONREACTIVE
Hep B C IgM: NONREACTIVE
Hepatitis B Surface Ag: NONREACTIVE

## 2022-08-22 LAB — CMP (CANCER CENTER ONLY)
ALT: 26 U/L (ref 0–44)
AST: 20 U/L (ref 15–41)
Albumin: 4.6 g/dL (ref 3.5–5.0)
Alkaline Phosphatase: 68 U/L (ref 38–126)
Anion gap: 7 (ref 5–15)
BUN: 20 mg/dL (ref 6–20)
CO2: 23 mmol/L (ref 22–32)
Calcium: 10 mg/dL (ref 8.9–10.3)
Chloride: 107 mmol/L (ref 98–111)
Creatinine: 1 mg/dL (ref 0.61–1.24)
GFR, Estimated: >60 mL/min (ref >60)
Glucose, Bld: 106 mg/dL — ABNORMAL HIGH (ref 70–99)
Potassium: 4.3 mmol/L (ref 3.5–5.1)
Sodium: 137 mmol/L (ref 135–145)
Total Bilirubin: 0.3 mg/dL (ref 0.3–1.2)
Total Protein: 8.5 g/dL — ABNORMAL HIGH (ref 6.5–8.1)

## 2022-08-22 LAB — HIV ANTIBODY (ROUTINE TESTING W REFLEX): HIV Screen 4th Generation wRfx: NONREACTIVE

## 2022-08-22 LAB — LACTATE DEHYDROGENASE: LDH: 150 U/L (ref 98–192)
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
## 2022-08-23 LAB — HTLV I+II ANTIBODIES, (EIA), BLD: HTLV I/II Ab: NEGATIVE

## 2022-08-23 LAB — LYME DISEASE SEROLOGY W/REFLEX: Lyme Total Antibody EIA: NEGATIVE

## 2022-08-23 LAB — COXSACKIE A VIRUS ANTIBODIES
Coxsackie A16 IgG: 1:100 {titer} — ABNORMAL HIGH
Coxsackie A16 IgM: NEGATIVE titer
Coxsackie A24 IgG: 1:100 {titer} — ABNORMAL HIGH
Coxsackie A24 IgM: NEGATIVE titer
Coxsackie A7 IgG: 1:100 {titer} — ABNORMAL HIGH
Coxsackie A7 IgM: NEGATIVE titer
Coxsackie A9 IgG: 1:100 {titer} — ABNORMAL HIGH
Coxsackie A9 IgM: NEGATIVE titer

## 2022-08-23 LAB — RPR: RPR Ser Ql: NONREACTIVE

## 2022-08-24 LAB — COXSACKIE B VIRUS ANTIBODIES
Coxsackie B1 Ab: NEGATIVE
Coxsackie B2 Ab: NEGATIVE
Coxsackie B4 Ab: NEGATIVE
Coxsackie B5 Ab: NEGATIVE
Coxsackie B6 Ab: NEGATIVE

## 2022-08-24 LAB — EPSTEIN BARR VRS(EBV DNA BY PCR): EBV DNA QN by PCR: NEGATIVE IU/mL

## 2022-08-25 LAB — CMV DNA BY PCR, QUALITATIVE: CMV DNA, Qual PCR: NEGATIVE

## 2022-08-26 LAB — QUANTIFERON-TB GOLD PLUS: QuantiFERON-TB Gold Plus: NEGATIVE

## 2022-08-26 LAB — QUANTIFERON-TB GOLD PLUS (RQFGPL)
QuantiFERON Mitogen Value: >10 IU/mL
QuantiFERON Nil Value: 0.1 IU/mL
QuantiFERON TB1 Ag Value: 0.09 IU/mL
QuantiFERON TB2 Ag Value: 0.09 IU/mL

## 2022-08-28 LAB — MISC LABCORP TEST (SEND OUT): Labcorp test code: 9985

## 2022-08-31 DIAGNOSIS — N6012 Diffuse cystic mastopathy of left breast: Secondary | ICD-10-CM | POA: Diagnosis not present

## 2022-08-31 DIAGNOSIS — R928 Other abnormal and inconclusive findings on diagnostic imaging of breast: Secondary | ICD-10-CM | POA: Diagnosis not present

## 2022-08-31 DIAGNOSIS — R599 Enlarged lymph nodes, unspecified: Secondary | ICD-10-CM | POA: Diagnosis not present

## 2022-08-31 DIAGNOSIS — N6011 Diffuse cystic mastopathy of right breast: Secondary | ICD-10-CM | POA: Diagnosis not present

## 2022-09-02 LAB — BARTONELLA ANTIBODY PANEL
B Quintana IgM: NEGATIVE titer
B henselae IgG: NEGATIVE titer
B henselae IgM: NEGATIVE titer
B quintana IgG: NEGATIVE titer

## 2022-09-04 ENCOUNTER — Encounter: Payer: Self-pay | Admitting: Oncology

## 2022-09-05 ENCOUNTER — Encounter: Payer: Self-pay | Admitting: Oncology

## 2022-09-05 ENCOUNTER — Inpatient Hospital Stay: Payer: Federal, State, Local not specified - PPO | Attending: Oncology | Admitting: Oncology

## 2022-09-05 VITALS — BP 154/80 | HR 52 | Temp 98.0°F | Resp 16 | Ht 73.0 in | Wt 296.0 lb

## 2022-09-05 DIAGNOSIS — B341 Enterovirus infection, unspecified: Secondary | ICD-10-CM

## 2022-09-05 DIAGNOSIS — R599 Enlarged lymph nodes, unspecified: Secondary | ICD-10-CM

## 2022-09-05 DIAGNOSIS — Z7189 Other specified counseling: Secondary | ICD-10-CM | POA: Diagnosis not present
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
## 2022-09-07 DIAGNOSIS — B341 Enterovirus infection, unspecified: Secondary | ICD-10-CM | POA: Insufficient documentation

## 2022-09-07 DIAGNOSIS — Z7189 Other specified counseling: Secondary | ICD-10-CM | POA: Insufficient documentation

## 2022-09-12 ENCOUNTER — Ambulatory Visit: Payer: Federal, State, Local not specified - PPO | Admitting: Sports Medicine

## 2022-09-13 ENCOUNTER — Ambulatory Visit: Payer: Federal, State, Local not specified - PPO | Admitting: Sports Medicine

## 2022-09-13 ENCOUNTER — Encounter: Payer: Self-pay | Admitting: Sports Medicine

## 2022-09-13 ENCOUNTER — Ambulatory Visit (INDEPENDENT_AMBULATORY_CARE_PROVIDER_SITE_OTHER): Payer: Federal, State, Local not specified - PPO

## 2022-09-13 DIAGNOSIS — M6752 Plica syndrome, left knee: Secondary | ICD-10-CM | POA: Diagnosis not present

## 2022-09-13 DIAGNOSIS — M79672 Pain in left foot: Secondary | ICD-10-CM | POA: Diagnosis not present

## 2022-09-13 DIAGNOSIS — M25562 Pain in left knee: Secondary | ICD-10-CM | POA: Diagnosis not present
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
## 2022-09-21 ENCOUNTER — Encounter: Payer: Self-pay | Admitting: Oncology

## 2022-09-26 ENCOUNTER — Other Ambulatory Visit: Payer: Self-pay | Admitting: Legal Medicine

## 2022-09-26 DIAGNOSIS — E1169 Type 2 diabetes mellitus with other specified complication: Secondary | ICD-10-CM

## 2022-10-04 ENCOUNTER — Ambulatory Visit: Payer: Federal, State, Local not specified - PPO | Admitting: Physician Assistant

## 2022-10-04 ENCOUNTER — Encounter: Payer: Self-pay | Admitting: Physician Assistant

## 2022-10-04 VITALS — BP 112/82 | HR 64 | Temp 97.7°F | Ht 74.0 in | Wt 290.2 lb

## 2022-10-04 DIAGNOSIS — J069 Acute upper respiratory infection, unspecified: Secondary | ICD-10-CM

## 2022-10-04 LAB — POC COVID19 BINAXNOW: SARS Coronavirus 2 Ag: NEGATIVE

## 2022-10-04 MED ORDER — AZITHROMYCIN 250 MG PO TABS: ORAL_TABLET | ORAL | 0 refills | Status: AC
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
## 2022-10-10 ENCOUNTER — Ambulatory Visit: Payer: Federal, State, Local not specified - PPO

## 2022-10-10 LAB — HM DIABETES EYE EXAM

## 2022-10-16 ENCOUNTER — Encounter: Payer: Self-pay | Admitting: Family Medicine

## 2022-10-31 DIAGNOSIS — N400 Enlarged prostate without lower urinary tract symptoms: Secondary | ICD-10-CM | POA: Insufficient documentation
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
## 2022-11-01 ENCOUNTER — Ambulatory Visit: Payer: Federal, State, Local not specified - PPO | Admitting: Legal Medicine

## 2022-11-01 ENCOUNTER — Encounter: Payer: Self-pay | Admitting: Legal Medicine

## 2022-11-01 VITALS — BP 110/68 | HR 55 | Temp 96.9°F | Ht 73.0 in | Wt 291.0 lb

## 2022-11-01 DIAGNOSIS — N400 Enlarged prostate without lower urinary tract symptoms: Secondary | ICD-10-CM | POA: Diagnosis not present

## 2022-11-01 DIAGNOSIS — G894 Chronic pain syndrome: Secondary | ICD-10-CM

## 2022-11-01 DIAGNOSIS — E1169 Type 2 diabetes mellitus with other specified complication: Secondary | ICD-10-CM | POA: Diagnosis not present

## 2022-11-01 DIAGNOSIS — N529 Male erectile dysfunction, unspecified: Secondary | ICD-10-CM

## 2022-11-01 DIAGNOSIS — Z6838 Body mass index (BMI) 38.0-38.9, adult: Secondary | ICD-10-CM

## 2022-11-01 DIAGNOSIS — E782 Mixed hyperlipidemia: Secondary | ICD-10-CM

## 2022-11-01 DIAGNOSIS — M5416 Radiculopathy, lumbar region: Secondary | ICD-10-CM

## 2022-11-01 MED ORDER — OXYCODONE HCL 10 MG PO TABS: 10.0000 mg | ORAL_TABLET | Freq: Three times a day (TID) | ORAL | 0 refills | Status: DC | PRN

## 2022-11-01 MED ORDER — SILDENAFIL CITRATE 20 MG PO TABS: 20.0000 mg | ORAL_TABLET | ORAL | 2 refills | Status: AC | PRN

## 2022-11-02 LAB — COMPREHENSIVE METABOLIC PANEL
ALT: 31 IU/L (ref 0–44)
AST: 28 IU/L (ref 0–40)
Albumin/Globulin Ratio: 2.1 (ref 1.2–2.2)
Albumin: 4.8 g/dL (ref 3.8–4.9)
Alkaline Phosphatase: 66 IU/L (ref 44–121)
BUN/Creatinine Ratio: 24 — ABNORMAL HIGH (ref 9–20)
BUN: 20 mg/dL (ref 6–24)
Bilirubin Total: 0.4 mg/dL (ref 0.0–1.2)
CO2: 22 mmol/L (ref 20–29)
Calcium: 9.3 mg/dL (ref 8.7–10.2)
Chloride: 103 mmol/L (ref 96–106)
Creatinine, Ser: 0.85 mg/dL (ref 0.76–1.27)
Globulin, Total: 2.3 g/dL (ref 1.5–4.5)
Glucose: 147 mg/dL — ABNORMAL HIGH (ref 70–99)
Potassium: 4.7 mmol/L (ref 3.5–5.2)
Sodium: 139 mmol/L (ref 134–144)
Total Protein: 7.1 g/dL (ref 6.0–8.5)
eGFR: 102 mL/min/{1.73_m2} (ref >59)

## 2022-11-02 LAB — CBC WITH DIFFERENTIAL/PLATELET
Basophils Absolute: 0.1 10*3/uL (ref 0.0–0.2)
Basos: 1 %
EOS (ABSOLUTE): 0.3 10*3/uL (ref 0.0–0.4)
Eos: 5 %
Hematocrit: 44.2 % (ref 37.5–51.0)
Hemoglobin: 14.8 g/dL (ref 13.0–17.7)
Immature Grans (Abs): 0 10*3/uL (ref 0.0–0.1)
Immature Granulocytes: 0 %
Lymphocytes Absolute: 1.3 10*3/uL (ref 0.7–3.1)
Lymphs: 23 %
MCH: 29.4 pg (ref 26.6–33.0)
MCHC: 33.5 g/dL (ref 31.5–35.7)
MCV: 88 fL (ref 79–97)
Monocytes Absolute: 0.5 10*3/uL (ref 0.1–0.9)
Monocytes: 9 %
Neutrophils Absolute: 3.7 10*3/uL (ref 1.4–7.0)
Neutrophils: 62 %
Platelets: 296 10*3/uL (ref 150–450)
RBC: 5.04 x10E6/uL (ref 4.14–5.80)
RDW: 14.4 % (ref 11.6–15.4)
WBC: 5.9 10*3/uL (ref 3.4–10.8)

## 2022-11-02 LAB — LIPID PANEL
Chol/HDL Ratio: 2.3 ratio (ref 0.0–5.0)
Cholesterol, Total: 199 mg/dL (ref 100–199)
HDL: 88 mg/dL (ref >39)
LDL Chol Calc (NIH): 104 mg/dL — ABNORMAL HIGH (ref 0–99)
Triglycerides: 37 mg/dL (ref 0–149)
VLDL Cholesterol Cal: 7 mg/dL (ref 5–40)

## 2022-11-02 LAB — CARDIOVASCULAR RISK ASSESSMENT

## 2022-11-02 LAB — TSH: TSH: 1.54 u[IU]/mL (ref 0.450–4.500)

## 2022-11-02 LAB — HEMOGLOBIN A1C
Est. average glucose Bld gHb Est-mCnc: 140 mg/dL
Hgb A1c MFr Bld: 6.5 % — ABNORMAL HIGH (ref 4.8–5.6)

## 2022-11-02 NOTE — Progress Notes (Signed)
Glucose 147, kidney and liver tests normal, A1c 6.5 improved, LDL cholesterol 104, need less than 100, CBC normal, TSH 1.54 normal,  lp

## 2022-11-30 ENCOUNTER — Telehealth: Payer: Self-pay | Admitting: Sports Medicine

## 2022-11-30 ENCOUNTER — Other Ambulatory Visit: Payer: Self-pay | Admitting: Sports Medicine

## 2022-11-30 DIAGNOSIS — M25562 Pain in left knee: Secondary | ICD-10-CM

## 2022-11-30 NOTE — Telephone Encounter (Signed)
Pt called requesting a referral for MRI of left knee. Please call pt about this matter at [phone]. Pt would like to go to [facility]

## 2022-11-30 NOTE — Telephone Encounter (Signed)
Mri order submitted.  

## 2022-12-13 ENCOUNTER — Telehealth: Payer: Self-pay | Admitting: Sports Medicine

## 2022-12-13 NOTE — Telephone Encounter (Signed)
Patient wants to know if the referral for his MRI was sent to [facility]. Please call and advise.Marland Kitchen

## 2022-12-14 NOTE — Telephone Encounter (Signed)
Left message informing him that we faxed order yesterday after insurance approval

## 2022-12-25 DIAGNOSIS — M25562 Pain in left knee: Secondary | ICD-10-CM | POA: Diagnosis not present

## 2023-02-07 ENCOUNTER — Other Ambulatory Visit: Payer: Self-pay | Admitting: Sports Medicine

## 2023-02-07 ENCOUNTER — Ambulatory Visit: Payer: Federal, State, Local not specified - PPO | Admitting: Sports Medicine

## 2023-02-07 ENCOUNTER — Encounter: Payer: Self-pay | Admitting: Sports Medicine

## 2023-02-07 DIAGNOSIS — S83249A Other tear of medial meniscus, current injury, unspecified knee, initial encounter: Secondary | ICD-10-CM

## 2023-02-07 DIAGNOSIS — S83242A Other tear of medial meniscus, current injury, left knee, initial encounter: Secondary | ICD-10-CM

## 2023-02-07 DIAGNOSIS — M79672 Pain in left foot: Secondary | ICD-10-CM | POA: Diagnosis not present

## 2023-02-07 DIAGNOSIS — M21622 Bunionette of left foot: Secondary | ICD-10-CM
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
## 2023-02-27 ENCOUNTER — Other Ambulatory Visit: Payer: Self-pay

## 2023-02-27 DIAGNOSIS — J0181 Other acute recurrent sinusitis: Secondary | ICD-10-CM

## 2023-02-27 MED ORDER — FLUTICASONE PROPIONATE 50 MCG/ACT NA SUSP
2.0000 | Freq: Every day | NASAL | 2 refills | Status: DC
Start: 2023-02-27 — End: 2023-09-17

## 2023-03-01 ENCOUNTER — Ambulatory Visit (INDEPENDENT_AMBULATORY_CARE_PROVIDER_SITE_OTHER): Payer: Federal, State, Local not specified - PPO | Admitting: Orthopedic Surgery

## 2023-03-01 ENCOUNTER — Other Ambulatory Visit: Payer: Self-pay

## 2023-03-01 DIAGNOSIS — S83242A Other tear of medial meniscus, current injury, left knee, initial encounter: Secondary | ICD-10-CM | POA: Diagnosis not present

## 2023-03-01 DIAGNOSIS — S83249A Other tear of medial meniscus, current injury, unspecified knee, initial encounter: Secondary | ICD-10-CM

## 2023-03-01 MED ORDER — OMEGA-3-ACID ETHYL ESTERS 1 G PO CAPS: 2.0000 g | ORAL_CAPSULE | Freq: Two times a day (BID) | ORAL | 1 refills | Status: DC

## 2023-03-03 ENCOUNTER — Encounter: Payer: Self-pay | Admitting: Orthopedic Surgery
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
## 2023-03-07 ENCOUNTER — Telehealth: Payer: Self-pay | Admitting: Orthopedic Surgery

## 2023-03-07 NOTE — Telephone Encounter (Signed)
Left message on patient's voicemail to call my direct number [phone] to discuss surgery date for left knee meniscal root repair with Dr. August Saucer.

## 2023-04-09 ENCOUNTER — Encounter: Payer: Federal, State, Local not specified - PPO | Admitting: Orthopedic Surgery

## 2023-05-04 ENCOUNTER — Ambulatory Visit: Payer: Federal, State, Local not specified - PPO | Admitting: Nurse Practitioner

## 2023-05-07 ENCOUNTER — Ambulatory Visit: Payer: Federal, State, Local not specified - PPO | Admitting: Physician Assistant

## 2023-05-23 ENCOUNTER — Encounter: Payer: Self-pay | Admitting: Physician Assistant

## 2023-05-23 ENCOUNTER — Ambulatory Visit: Payer: Federal, State, Local not specified - PPO | Admitting: Physician Assistant

## 2023-05-23 VITALS — BP 110/70 | HR 59 | Temp 97.4°F | Ht 74.0 in | Wt 300.6 lb

## 2023-05-23 DIAGNOSIS — N400 Enlarged prostate without lower urinary tract symptoms: Secondary | ICD-10-CM

## 2023-05-23 DIAGNOSIS — G894 Chronic pain syndrome: Secondary | ICD-10-CM | POA: Diagnosis not present

## 2023-05-23 DIAGNOSIS — E782 Mixed hyperlipidemia: Secondary | ICD-10-CM | POA: Diagnosis not present

## 2023-05-23 DIAGNOSIS — Z7984 Long term (current) use of oral hypoglycemic drugs: Secondary | ICD-10-CM

## 2023-05-23 DIAGNOSIS — E1165 Type 2 diabetes mellitus with hyperglycemia: Secondary | ICD-10-CM | POA: Diagnosis not present

## 2023-05-23 DIAGNOSIS — Z5320 Procedure and treatment not carried out because of patient's decision for unspecified reasons: Secondary | ICD-10-CM
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
## 2023-05-24 ENCOUNTER — Other Ambulatory Visit: Payer: Self-pay | Admitting: Physician Assistant

## 2023-05-24 ENCOUNTER — Telehealth: Payer: Self-pay

## 2023-05-24 DIAGNOSIS — M5416 Radiculopathy, lumbar region: Secondary | ICD-10-CM

## 2023-05-24 LAB — COMPREHENSIVE METABOLIC PANEL WITH GFR
ALT: 29 IU/L (ref 0–44)
AST: 24 IU/L (ref 0–40)
Albumin: 4.7 g/dL (ref 3.8–4.9)
Alkaline Phosphatase: 67 IU/L (ref 44–121)
BUN/Creatinine Ratio: 28 — ABNORMAL HIGH (ref 9–20)
BUN: 25 mg/dL — ABNORMAL HIGH (ref 6–24)
Bilirubin Total: 0.6 mg/dL (ref 0.0–1.2)
CO2: 24 mmol/L (ref 20–29)
Calcium: 9.4 mg/dL (ref 8.7–10.2)
Chloride: 103 mmol/L (ref 96–106)
Creatinine, Ser: 0.88 mg/dL (ref 0.76–1.27)
Globulin, Total: 2.5 g/dL (ref 1.5–4.5)
Glucose: 121 mg/dL — ABNORMAL HIGH (ref 70–99)
Potassium: 5.1 mmol/L (ref 3.5–5.2)
Sodium: 140 mmol/L (ref 134–144)
Total Protein: 7.2 g/dL (ref 6.0–8.5)
eGFR: 100 mL/min/1.73

## 2023-05-24 LAB — CBC WITH DIFFERENTIAL/PLATELET
Basophils Absolute: 0.1 10*3/uL (ref 0.0–0.2)
Basos: 1 %
EOS (ABSOLUTE): 0.2 10*3/uL (ref 0.0–0.4)
Eos: 3 %
Hematocrit: 45.1 % (ref 37.5–51.0)
Hemoglobin: 14.6 g/dL (ref 13.0–17.7)
Immature Grans (Abs): 0 10*3/uL (ref 0.0–0.1)
Immature Granulocytes: 1 %
Lymphocytes Absolute: 2.1 10*3/uL (ref 0.7–3.1)
Lymphs: 26 %
MCH: 28.6 pg (ref 26.6–33.0)
MCHC: 32.4 g/dL (ref 31.5–35.7)
MCV: 88 fL (ref 79–97)
Monocytes Absolute: 0.7 10*3/uL (ref 0.1–0.9)
Monocytes: 9 %
Neutrophils Absolute: 5.2 10*3/uL (ref 1.4–7.0)
Neutrophils: 60 %
Platelets: 295 10*3/uL (ref 150–450)
RBC: 5.1 x10E6/uL (ref 4.14–5.80)
RDW: 13.5 % (ref 11.6–15.4)
WBC: 8.3 10*3/uL (ref 3.4–10.8)

## 2023-05-24 LAB — HEMOGLOBIN A1C
Est. average glucose Bld gHb Est-mCnc: 160 mg/dL
Hgb A1c MFr Bld: 7.2 % — ABNORMAL HIGH (ref 4.8–5.6)

## 2023-05-24 LAB — LIPID PANEL
Chol/HDL Ratio: 2.4 ratio (ref 0.0–5.0)
Cholesterol, Total: 207 mg/dL — ABNORMAL HIGH (ref 100–199)
HDL: 85 mg/dL (ref >39)
LDL Chol Calc (NIH): 114 mg/dL — ABNORMAL HIGH (ref 0–99)
Triglycerides: 43 mg/dL (ref 0–149)
VLDL Cholesterol Cal: 8 mg/dL (ref 5–40)

## 2023-05-24 LAB — TSH: TSH: 0.973 u[IU]/mL (ref 0.450–4.500)

## 2023-05-24 LAB — PSA: Prostate Specific Ag, Serum: 0.4 ng/mL (ref 0.0–4.0)

## 2023-05-24 MED ORDER — OXYCODONE HCL 10 MG PO TABS
10.0000 mg | ORAL_TABLET | Freq: Three times a day (TID) | ORAL | 0 refills | Status: DC | PRN
Start: 2023-05-24 — End: 2023-12-04

## 2023-05-28 NOTE — Telephone Encounter (Signed)
error 

## 2023-06-20 ENCOUNTER — Other Ambulatory Visit: Payer: Self-pay

## 2023-06-20 DIAGNOSIS — E1169 Type 2 diabetes mellitus with other specified complication: Secondary | ICD-10-CM

## 2023-06-20 MED ORDER — METFORMIN HCL 500 MG PO TABS
500.0000 mg | ORAL_TABLET | Freq: Every day | ORAL | 1 refills | Status: DC
Start: 2023-06-20 — End: 2023-12-16

## 2023-09-10 ENCOUNTER — Encounter: Payer: Self-pay | Admitting: Orthopedic Surgery

## 2023-09-10 ENCOUNTER — Ambulatory Visit: Payer: Federal, State, Local not specified - PPO | Admitting: Orthopedic Surgery

## 2023-09-10 DIAGNOSIS — S83249A Other tear of medial meniscus, current injury, unspecified knee, initial encounter: Secondary | ICD-10-CM
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
## 2023-09-15 ENCOUNTER — Other Ambulatory Visit: Payer: Self-pay | Admitting: Family Medicine

## 2023-09-15 DIAGNOSIS — J0181 Other acute recurrent sinusitis: Secondary | ICD-10-CM

## 2023-10-19 ENCOUNTER — Telehealth: Payer: Self-pay | Admitting: Orthopedic Surgery

## 2023-10-19 NOTE — Telephone Encounter (Signed)
Called patient to schedule knee surgery. Left message to return call.

## 2023-12-04 ENCOUNTER — Ambulatory Visit: Payer: Federal, State, Local not specified - PPO | Admitting: Physician Assistant

## 2023-12-04 ENCOUNTER — Encounter: Payer: Self-pay | Admitting: Physician Assistant

## 2023-12-04 VITALS — BP 134/72 | HR 52 | Temp 97.8°F | Ht 74.0 in | Wt 301.0 lb

## 2023-12-04 DIAGNOSIS — E1165 Type 2 diabetes mellitus with hyperglycemia: Secondary | ICD-10-CM | POA: Diagnosis not present

## 2023-12-04 DIAGNOSIS — E1169 Type 2 diabetes mellitus with other specified complication: Secondary | ICD-10-CM

## 2023-12-04 DIAGNOSIS — N400 Enlarged prostate without lower urinary tract symptoms: Secondary | ICD-10-CM

## 2023-12-04 DIAGNOSIS — M5416 Radiculopathy, lumbar region: Secondary | ICD-10-CM

## 2023-12-04 DIAGNOSIS — G894 Chronic pain syndrome: Secondary | ICD-10-CM | POA: Diagnosis not present

## 2023-12-04 DIAGNOSIS — E782 Mixed hyperlipidemia: Secondary | ICD-10-CM | POA: Diagnosis not present

## 2023-12-04 DIAGNOSIS — E785 Hyperlipidemia, unspecified: Secondary | ICD-10-CM

## 2023-12-04 LAB — CBC WITH DIFFERENTIAL/PLATELET
Basophils Absolute: 0.1 10*3/uL (ref 0.0–0.2)
Basos: 1 %
EOS (ABSOLUTE): 0.3 10*3/uL (ref 0.0–0.4)
Eos: 3 %
Hematocrit: 50.5 % (ref 37.5–51.0)
Hemoglobin: 16.2 g/dL (ref 13.0–17.7)
Immature Grans (Abs): 0 10*3/uL (ref 0.0–0.1)
Immature Granulocytes: 0 %
Lymphocytes Absolute: 2.1 10*3/uL (ref 0.7–3.1)
Lymphs: 22 %
MCH: 29.6 pg (ref 26.6–33.0)
MCHC: 32.1 g/dL (ref 31.5–35.7)
MCV: 92 fL (ref 79–97)
Monocytes Absolute: 0.7 10*3/uL (ref 0.1–0.9)
Monocytes: 7 %
Neutrophils Absolute: 6.1 10*3/uL (ref 1.4–7.0)
Neutrophils: 67 %
Platelets: 321 10*3/uL (ref 150–450)
RBC: 5.48 x10E6/uL (ref 4.14–5.80)
RDW: 13.2 % (ref 11.6–15.4)
WBC: 9.3 10*3/uL (ref 3.4–10.8)

## 2023-12-04 LAB — COMPREHENSIVE METABOLIC PANEL
ALT: 31 [IU]/L (ref 0–44)
AST: 23 [IU]/L (ref 0–40)
Albumin: 4.7 g/dL (ref 3.8–4.9)
Alkaline Phosphatase: 77 [IU]/L (ref 44–121)
BUN/Creatinine Ratio: 28 — ABNORMAL HIGH (ref 9–20)
BUN: 23 mg/dL (ref 6–24)
Bilirubin Total: 0.5 mg/dL (ref 0.0–1.2)
CO2: 22 mmol/L (ref 20–29)
Calcium: 9.4 mg/dL (ref 8.7–10.2)
Chloride: 102 mmol/L (ref 96–106)
Creatinine, Ser: 0.81 mg/dL (ref 0.76–1.27)
Globulin, Total: 2.8 g/dL (ref 1.5–4.5)
Glucose: 164 mg/dL — ABNORMAL HIGH (ref 70–99)
Potassium: 4.9 mmol/L (ref 3.5–5.2)
Sodium: 139 mmol/L (ref 134–144)
Total Protein: 7.5 g/dL (ref 6.0–8.5)
eGFR: 103 mL/min/{1.73_m2} (ref >59)

## 2023-12-04 LAB — LIPID PANEL
Chol/HDL Ratio: 2.7 {ratio} (ref 0.0–5.0)
Cholesterol, Total: 241 mg/dL — ABNORMAL HIGH (ref 100–199)
HDL: 89 mg/dL (ref >39)
LDL Chol Calc (NIH): 144 mg/dL — ABNORMAL HIGH (ref 0–99)
Triglycerides: 51 mg/dL (ref 0–149)
VLDL Cholesterol Cal: 8 mg/dL (ref 5–40)

## 2023-12-04 LAB — HEMOGLOBIN A1C
Est. average glucose Bld gHb Est-mCnc: 160 mg/dL
Hgb A1c MFr Bld: 7.2 % — ABNORMAL HIGH (ref 4.8–5.6)

## 2023-12-04 MED ORDER — OXYCODONE HCL 10 MG PO TABS
10.0000 mg | ORAL_TABLET | Freq: Three times a day (TID) | ORAL | 0 refills | Status: DC | PRN
Start: 2023-12-04 — End: 2024-06-04
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
## 2023-12-05 LAB — MICROALBUMIN / CREATININE URINE RATIO
Creatinine, Urine: 112.1 mg/dL
Microalb/Creat Ratio: 4 mg/g{creat} (ref 0–29)
Microalbumin, Urine: 5 ug/mL

## 2023-12-13 ENCOUNTER — Ambulatory Visit (HOSPITAL_COMMUNITY): Admit: 2023-12-13 | Payer: Federal, State, Local not specified - PPO | Admitting: Orthopedic Surgery

## 2023-12-13 DIAGNOSIS — Z01818 Encounter for other preprocedural examination: Secondary | ICD-10-CM

## 2023-12-13 SURGERY — ARTHROSCOPY, KNEE, WITH MENISCUS REPAIR
Anesthesia: General | Site: Knee | Laterality: Left

## 2023-12-16 ENCOUNTER — Other Ambulatory Visit: Payer: Self-pay | Admitting: Physician Assistant

## 2023-12-16 DIAGNOSIS — E1169 Type 2 diabetes mellitus with other specified complication: Secondary | ICD-10-CM

## 2023-12-21 ENCOUNTER — Encounter: Payer: Federal, State, Local not specified - PPO | Admitting: Surgical

## 2024-04-30 ENCOUNTER — Other Ambulatory Visit: Payer: Self-pay | Admitting: Family Medicine

## 2024-04-30 DIAGNOSIS — J0181 Other acute recurrent sinusitis: Secondary | ICD-10-CM

## 2024-06-04 ENCOUNTER — Encounter: Payer: Self-pay | Admitting: Physician Assistant

## 2024-06-04 ENCOUNTER — Ambulatory Visit: Payer: Federal, State, Local not specified - PPO | Admitting: Physician Assistant

## 2024-06-04 VITALS — BP 128/78 | HR 58 | Temp 98.0°F | Ht 74.0 in | Wt 302.6 lb

## 2024-06-04 DIAGNOSIS — E1169 Type 2 diabetes mellitus with other specified complication: Secondary | ICD-10-CM

## 2024-06-04 DIAGNOSIS — N529 Male erectile dysfunction, unspecified: Secondary | ICD-10-CM

## 2024-06-04 DIAGNOSIS — M5416 Radiculopathy, lumbar region: Secondary | ICD-10-CM

## 2024-06-04 DIAGNOSIS — Z5320 Procedure and treatment not carried out because of patient's decision for unspecified reasons: Secondary | ICD-10-CM

## 2024-06-04 DIAGNOSIS — G894 Chronic pain syndrome: Secondary | ICD-10-CM

## 2024-06-04 DIAGNOSIS — N400 Enlarged prostate without lower urinary tract symptoms: Secondary | ICD-10-CM | POA: Diagnosis not present

## 2024-06-04 DIAGNOSIS — E785 Hyperlipidemia, unspecified: Secondary | ICD-10-CM | POA: Diagnosis not present

## 2024-06-04 DIAGNOSIS — E1165 Type 2 diabetes mellitus with hyperglycemia: Secondary | ICD-10-CM | POA: Diagnosis not present

## 2024-06-04 MED ORDER — OXYCODONE HCL 10 MG PO TABS: 10.0000 mg | ORAL_TABLET | Freq: Three times a day (TID) | ORAL | 0 refills | Status: AC | PRN
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
## 2024-06-05 ENCOUNTER — Ambulatory Visit: Payer: Self-pay | Admitting: Physician Assistant

## 2024-06-05 LAB — CBC WITH DIFFERENTIAL/PLATELET
Basophils Absolute: 0.1 x10E3/uL (ref 0.0–0.2)
Basos: 1 %
EOS (ABSOLUTE): 0.2 x10E3/uL (ref 0.0–0.4)
Eos: 3 %
Hematocrit: 47.4 % (ref 37.5–51.0)
Hemoglobin: 15.3 g/dL (ref 13.0–17.7)
Immature Grans (Abs): 0 x10E3/uL (ref 0.0–0.1)
Immature Granulocytes: 0 %
Lymphocytes Absolute: 1.9 x10E3/uL (ref 0.7–3.1)
Lymphs: 29 %
MCH: 29.4 pg (ref 26.6–33.0)
MCHC: 32.3 g/dL (ref 31.5–35.7)
MCV: 91 fL (ref 79–97)
Monocytes Absolute: 0.6 x10E3/uL (ref 0.1–0.9)
Monocytes: 9 %
Neutrophils Absolute: 3.7 x10E3/uL (ref 1.4–7.0)
Neutrophils: 58 %
Platelets: 301 x10E3/uL (ref 150–450)
RBC: 5.21 x10E6/uL (ref 4.14–5.80)
RDW: 13.7 % (ref 11.6–15.4)
WBC: 6.4 x10E3/uL (ref 3.4–10.8)

## 2024-06-05 LAB — COMPREHENSIVE METABOLIC PANEL WITH GFR
ALT: 40 IU/L (ref 0–44)
AST: 28 IU/L (ref 0–40)
Albumin: 4.6 g/dL (ref 3.8–4.9)
Alkaline Phosphatase: 72 IU/L (ref 44–121)
BUN/Creatinine Ratio: 21 — ABNORMAL HIGH (ref 9–20)
BUN: 19 mg/dL (ref 6–24)
Bilirubin Total: 0.5 mg/dL (ref 0.0–1.2)
CO2: 19 mmol/L — ABNORMAL LOW (ref 20–29)
Calcium: 9.9 mg/dL (ref 8.7–10.2)
Chloride: 102 mmol/L (ref 96–106)
Creatinine, Ser: 0.89 mg/dL (ref 0.76–1.27)
Globulin, Total: 2.7 g/dL (ref 1.5–4.5)
Glucose: 158 mg/dL — ABNORMAL HIGH (ref 70–99)
Potassium: 4.7 mmol/L (ref 3.5–5.2)
Sodium: 141 mmol/L (ref 134–144)
Total Protein: 7.3 g/dL (ref 6.0–8.5)
eGFR: 99 mL/min/1.73 (ref >59)

## 2024-06-05 LAB — PSA: Prostate Specific Ag, Serum: 0.5 ng/mL (ref 0.0–4.0)

## 2024-06-05 LAB — HEMOGLOBIN A1C
Est. average glucose Bld gHb Est-mCnc: 194 mg/dL
Hgb A1c MFr Bld: 8.4 % — ABNORMAL HIGH (ref 4.8–5.6)

## 2024-06-05 LAB — LIPID PANEL
Chol/HDL Ratio: 2.6 ratio (ref 0.0–5.0)
Cholesterol, Total: 215 mg/dL — ABNORMAL HIGH (ref 100–199)
HDL: 83 mg/dL (ref >39)
LDL Chol Calc (NIH): 124 mg/dL — ABNORMAL HIGH (ref 0–99)
Triglycerides: 46 mg/dL (ref 0–149)
VLDL Cholesterol Cal: 8 mg/dL (ref 5–40)

## 2024-06-05 LAB — TSH: TSH: 1.24 u[IU]/mL (ref 0.450–4.500)

## 2024-06-06 ENCOUNTER — Other Ambulatory Visit: Payer: Self-pay | Admitting: Physician Assistant

## 2024-06-06 ENCOUNTER — Telehealth: Payer: Self-pay | Admitting: Physician Assistant

## 2024-06-06 DIAGNOSIS — E1165 Type 2 diabetes mellitus with hyperglycemia: Secondary | ICD-10-CM

## 2024-06-06 MED ORDER — OZEMPIC (0.25 OR 0.5 MG/DOSE) 2 MG/3ML ~~LOC~~ SOPN: PEN_INJECTOR | SUBCUTANEOUS | 1 refills | Status: AC

## 2024-06-06 NOTE — Telephone Encounter (Unsigned)
 Copied from CRM [id number]. Topic: Clinical - Medication Question >> Jun 06, 2024  1:06 PM Donee H wrote: Reason for CRM: Mr. Samuel Robinson would like to know does he need to stop taking his medication metFORMIN  (GLUCOPHAGE ) 500 MG tablet once he begin to take his Ozempic . Patient is requesting a follow up call with further instructions. Callback number [phone]

## 2024-06-09 ENCOUNTER — Telehealth: Payer: Self-pay

## 2024-06-09 NOTE — Telephone Encounter (Signed)
 At this time he needs to continue glucophage  - will possibly be able to discontinue at a later time if glucose readings/A1c under better control after ozempic  dose increased

## 2024-06-09 NOTE — Telephone Encounter (Signed)
PA submitted and approved via covermymeds for ozempic. 

## 2024-06-10 ENCOUNTER — Other Ambulatory Visit: Payer: Self-pay | Admitting: Family Medicine

## 2024-06-10 DIAGNOSIS — E1169 Type 2 diabetes mellitus with other specified complication: Secondary | ICD-10-CM

## 2024-08-28 ENCOUNTER — Other Ambulatory Visit: Payer: Self-pay | Admitting: Physician Assistant

## 2024-08-28 DIAGNOSIS — E1165 Type 2 diabetes mellitus with hyperglycemia: Secondary | ICD-10-CM

## 2024-08-28 MED ORDER — OZEMPIC (0.25 OR 0.5 MG/DOSE) 2 MG/3ML ~~LOC~~ SOPN: 0.5000 mg | PEN_INJECTOR | SUBCUTANEOUS | 1 refills | Status: DC

## 2024-09-08 ENCOUNTER — Encounter: Payer: Self-pay | Admitting: Physician Assistant

## 2024-09-08 ENCOUNTER — Ambulatory Visit: Admitting: Physician Assistant

## 2024-09-08 VITALS — BP 118/80 | HR 57 | Temp 97.8°F | Resp 18 | Ht 74.0 in | Wt 296.0 lb

## 2024-09-08 DIAGNOSIS — E785 Hyperlipidemia, unspecified: Secondary | ICD-10-CM

## 2024-09-08 DIAGNOSIS — E1165 Type 2 diabetes mellitus with hyperglycemia: Secondary | ICD-10-CM

## 2024-09-08 DIAGNOSIS — G894 Chronic pain syndrome: Secondary | ICD-10-CM

## 2024-09-08 DIAGNOSIS — E1169 Type 2 diabetes mellitus with other specified complication: Secondary | ICD-10-CM | POA: Diagnosis not present

## 2024-09-08 DIAGNOSIS — Z5320 Procedure and treatment not carried out because of patient's decision for unspecified reasons: Secondary | ICD-10-CM

## 2024-09-08 LAB — CBC WITH DIFFERENTIAL/PLATELET
Basophils Absolute: 0.1 x10E3/uL (ref 0.0–0.2)
Basos: 1 %
EOS (ABSOLUTE): 0.3 x10E3/uL (ref 0.0–0.4)
Eos: 4 %
Hematocrit: 47 % (ref 37.5–51.0)
Hemoglobin: 15.3 g/dL (ref 13.0–17.7)
Immature Grans (Abs): 0 x10E3/uL (ref 0.0–0.1)
Immature Granulocytes: 0 %
Lymphocytes Absolute: 1.7 x10E3/uL (ref 0.7–3.1)
Lymphs: 25 %
MCH: 30.1 pg (ref 26.6–33.0)
MCHC: 32.6 g/dL (ref 31.5–35.7)
MCV: 93 fL (ref 79–97)
Monocytes Absolute: 0.5 x10E3/uL (ref 0.1–0.9)
Monocytes: 7 %
Neutrophils Absolute: 4.3 x10E3/uL (ref 1.4–7.0)
Neutrophils: 63 %
Platelets: 302 x10E3/uL (ref 150–450)
RBC: 5.08 x10E6/uL (ref 4.14–5.80)
RDW: 13.3 % (ref 11.6–15.4)
WBC: 6.9 x10E3/uL (ref 3.4–10.8)

## 2024-09-08 LAB — COMPREHENSIVE METABOLIC PANEL WITH GFR
ALT: 46 IU/L — ABNORMAL HIGH (ref 0–44)
AST: 30 IU/L (ref 0–40)
Albumin: 4.7 g/dL (ref 3.8–4.9)
Alkaline Phosphatase: 72 IU/L (ref 47–123)
BUN/Creatinine Ratio: 20 (ref 9–20)
BUN: 17 mg/dL (ref 6–24)
Bilirubin Total: 0.5 mg/dL (ref 0.0–1.2)
CO2: 22 mmol/L (ref 20–29)
Calcium: 9.6 mg/dL (ref 8.7–10.2)
Chloride: 103 mmol/L (ref 96–106)
Creatinine, Ser: 0.83 mg/dL (ref 0.76–1.27)
Globulin, Total: 2.5 g/dL (ref 1.5–4.5)
Glucose: 120 mg/dL — ABNORMAL HIGH (ref 70–99)
Potassium: 4.5 mmol/L (ref 3.5–5.2)
Sodium: 139 mmol/L (ref 134–144)
Total Protein: 7.2 g/dL (ref 6.0–8.5)
eGFR: 101 mL/min/1.73 (ref >59)

## 2024-09-08 LAB — LIPID PANEL
Chol/HDL Ratio: 2.8 ratio (ref 0.0–5.0)
Cholesterol, Total: 221 mg/dL — ABNORMAL HIGH (ref 100–199)
HDL: 79 mg/dL (ref >39)
LDL Chol Calc (NIH): 133 mg/dL — ABNORMAL HIGH (ref 0–99)
Triglycerides: 54 mg/dL (ref 0–149)
VLDL Cholesterol Cal: 9 mg/dL (ref 5–40)

## 2024-09-08 LAB — HEMOGLOBIN A1C
Est. average glucose Bld gHb Est-mCnc: 146 mg/dL
Hgb A1c MFr Bld: 6.7 % — ABNORMAL HIGH (ref 4.8–5.6)
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
## 2024-09-09 ENCOUNTER — Ambulatory Visit: Payer: Self-pay | Admitting: Physician Assistant

## 2024-09-29 ENCOUNTER — Encounter: Payer: Self-pay | Admitting: Radiology

## 2024-10-14 NOTE — Progress Notes (Signed)
   10/14/2024  Patient ID: Alm ONEIDA Law, male   DOB: 1966-07-09, 58 y.o.   MRN: [id number]  Pharmacy Quality Measure Review  This patient is appearing on a report for being at risk of failing the Glycemic Status Assessment in Diabetes measure this calendar year.   Last documented A1c or GMI 6.7 on 09/08/24  Lang Sieve, PharmD, BCGP Clinical Pharmacist  [phone]

## 2024-10-26 ENCOUNTER — Other Ambulatory Visit: Payer: Self-pay | Admitting: Physician Assistant

## 2024-10-26 DIAGNOSIS — E1165 Type 2 diabetes mellitus with hyperglycemia: Secondary | ICD-10-CM

## 2024-11-23 ENCOUNTER — Other Ambulatory Visit: Payer: Self-pay | Admitting: Physician Assistant

## 2024-12-05 ENCOUNTER — Other Ambulatory Visit: Payer: Self-pay | Admitting: Physician Assistant

## 2024-12-05 DIAGNOSIS — E1169 Type 2 diabetes mellitus with other specified complication: Secondary | ICD-10-CM

## 2024-12-11 ENCOUNTER — Ambulatory Visit: Admitting: Physician Assistant

## 2024-12-14 ENCOUNTER — Other Ambulatory Visit: Payer: Self-pay | Admitting: Physician Assistant

## 2024-12-14 DIAGNOSIS — E1165 Type 2 diabetes mellitus with hyperglycemia: Secondary | ICD-10-CM

## 2025-01-13 ENCOUNTER — Ambulatory Visit: Admitting: Physician Assistant
